# Patient Record
Sex: Female | Born: 1990 | Hispanic: No | Marital: Married | Smoking: Never smoker
Health system: Southern US, Community
[De-identification: ages and names within clinical notes are randomized; demographics above are authoritative.]

## PROBLEM LIST (undated history)

## (undated) DIAGNOSIS — G43909 Migraine, unspecified, not intractable, without status migrainosus: Secondary | ICD-10-CM

## (undated) HISTORY — DX: Migraine, unspecified, not intractable, without status migrainosus: G43.909

## (undated) HISTORY — PX: NO PAST SURGERIES: SHX2092

---
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
## 2010-01-05 ENCOUNTER — Emergency Department: Payer: Self-pay | Admitting: Emergency Medicine

## 2010-10-25 IMAGING — CT CT HEAD WITHOUT CONTRAST
2 series · 16 of 30 positions shown, 20 images · non-contrast
Comparison: none

REASON FOR EXAM: Headache New Onset Rt Sided
COMMENTS:

[Series 2: without · axial · non-contrast · 0.38mm/px · z∈[+690,+836]mm · 13 of 35 slices shown, 17 images]
[im 3/35  brain]
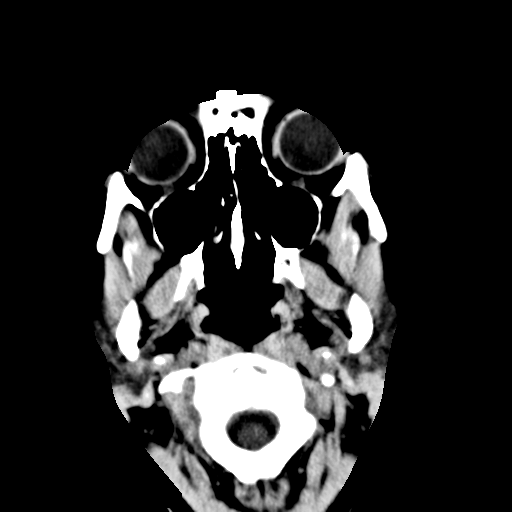
[im 3/35  bone]
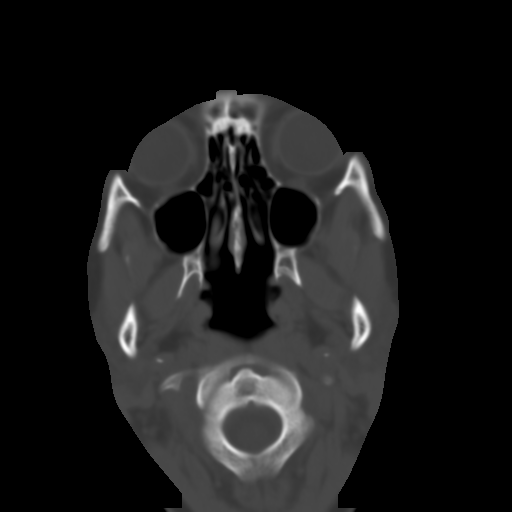
[im 5/35  brain]
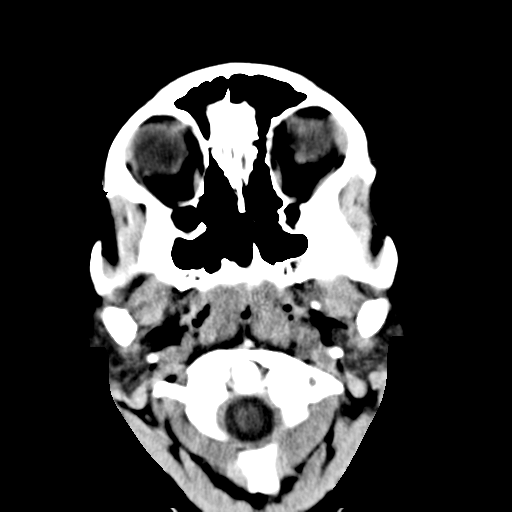
[im 8/35  brain]
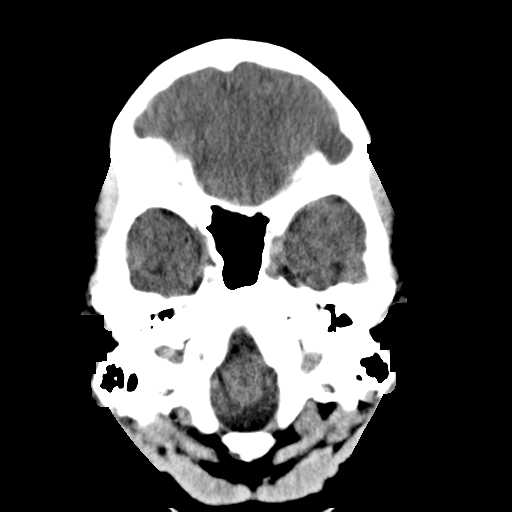
[im 10/35  brain]
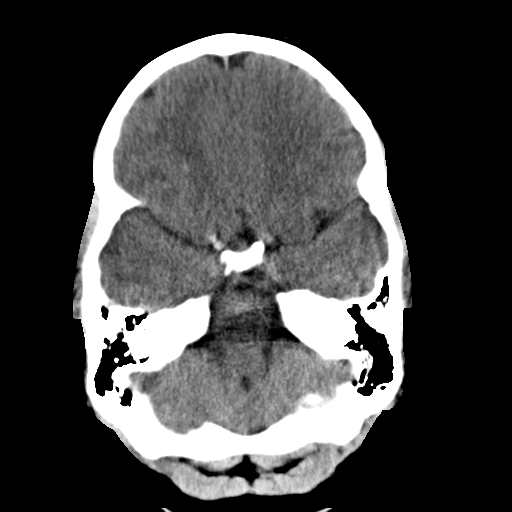
[im 13/35  brain]
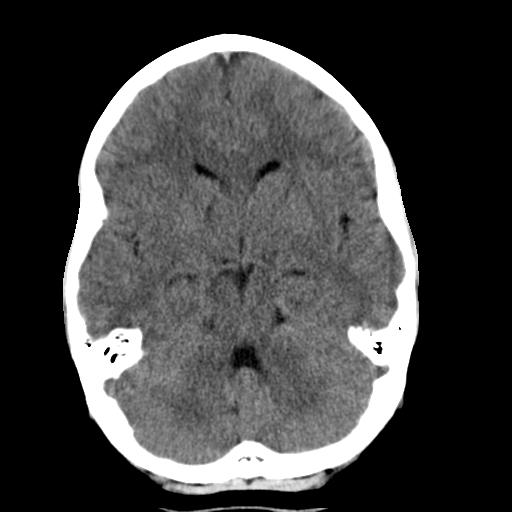
[im 13/35  bone]
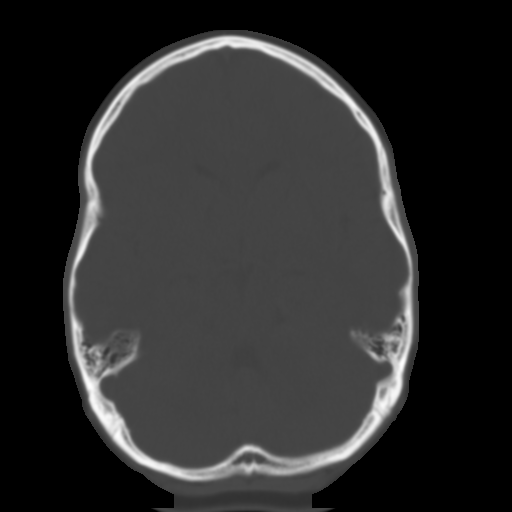
[im 15/35  brain]
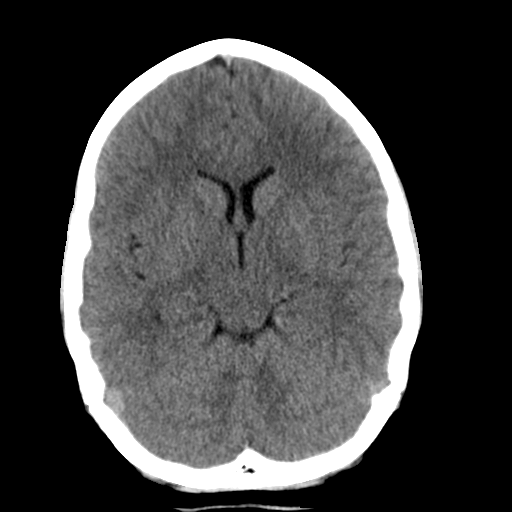
[im 18/35  brain]
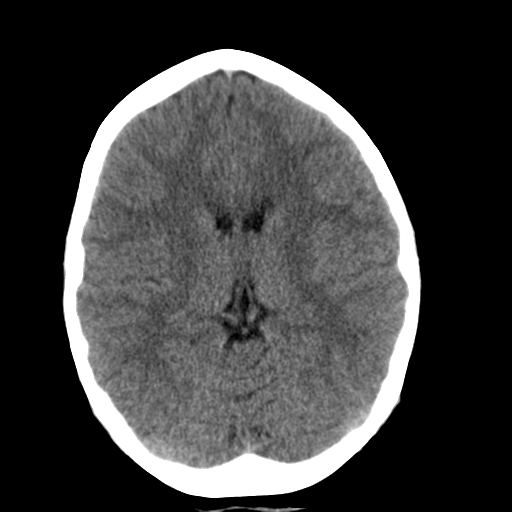
[im 20/35  brain]
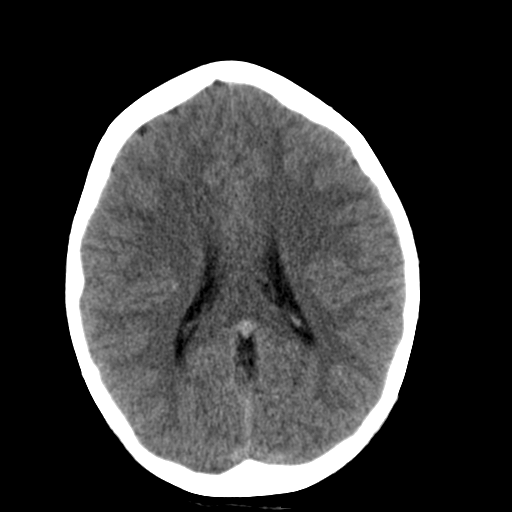
[im 22/35  brain]
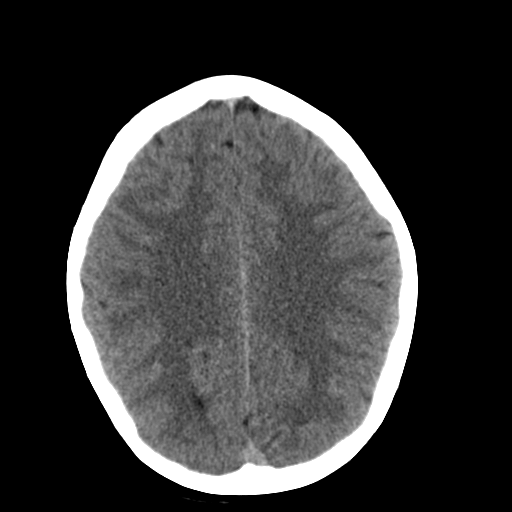
[im 22/35  bone]
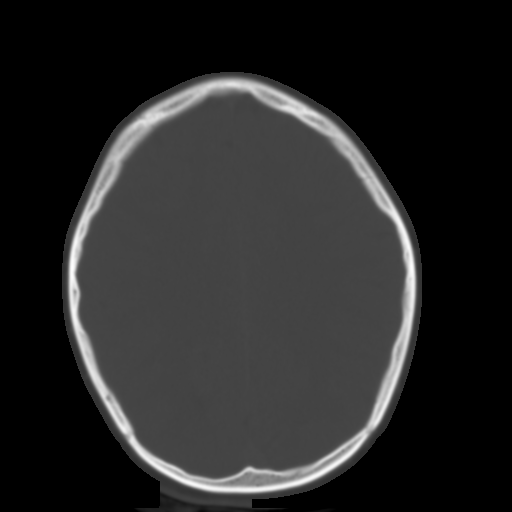
[im 25/35  brain]
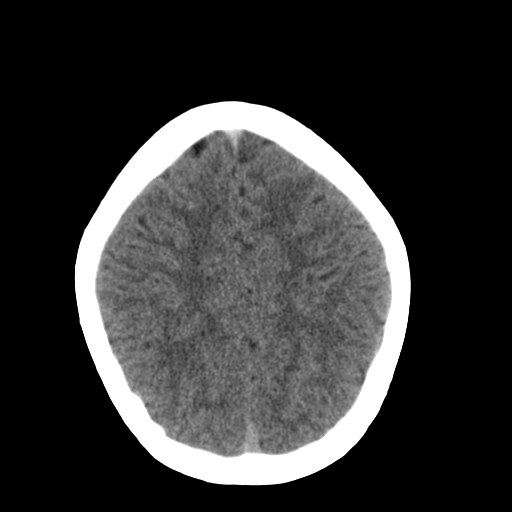
[im 27/35  brain]
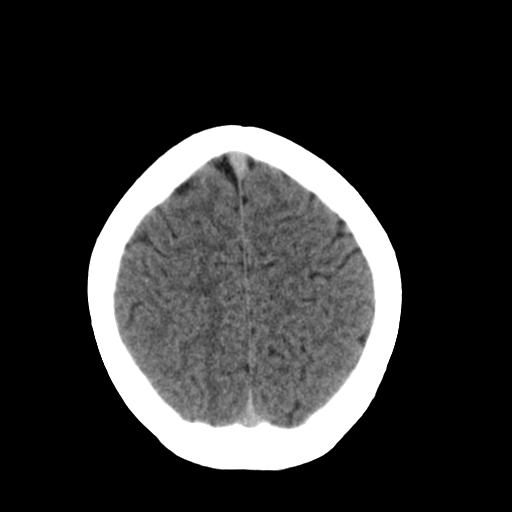
[im 30/35  brain]
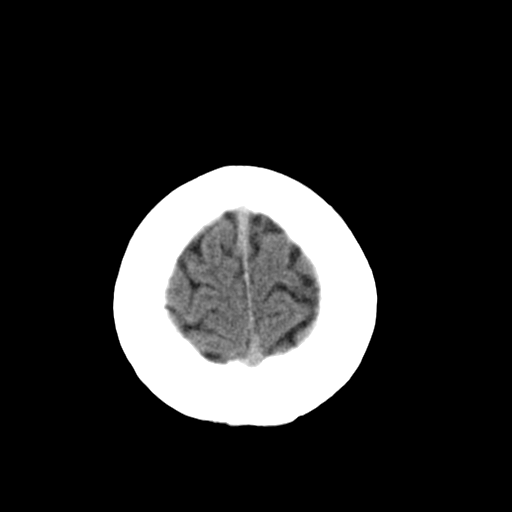
[im 32/35  brain]
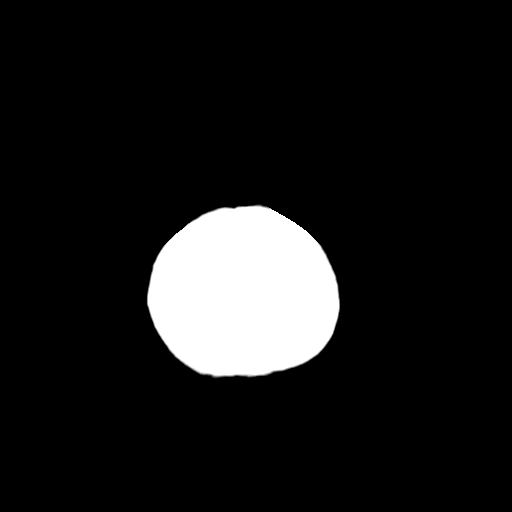
[im 32/35  bone]
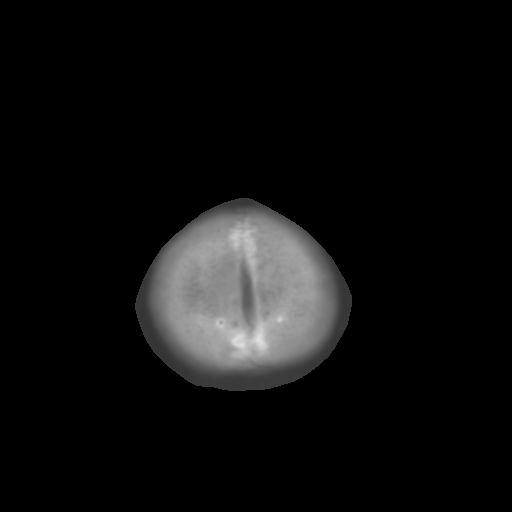

[Series 3: bone · axial · 0.38mm/px · z∈[+690,+740]mm · 3 of 35 slices shown]
[im 3/35  bone]
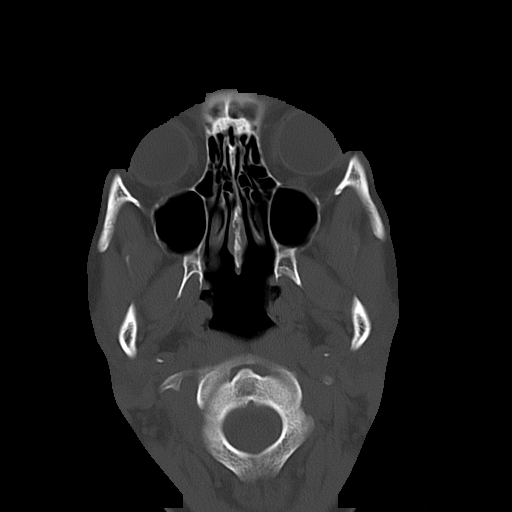
[im 8/35  bone]
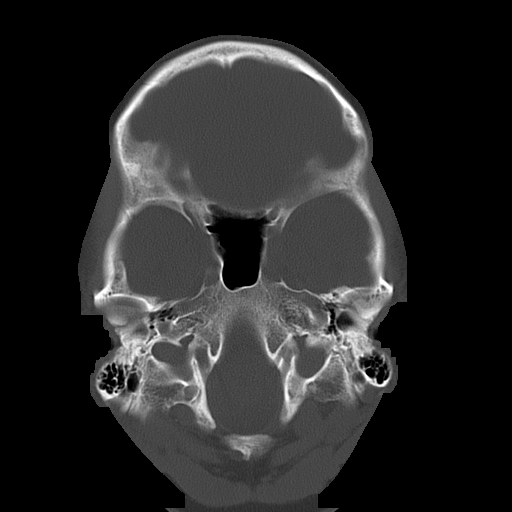
[im 13/35  bone]
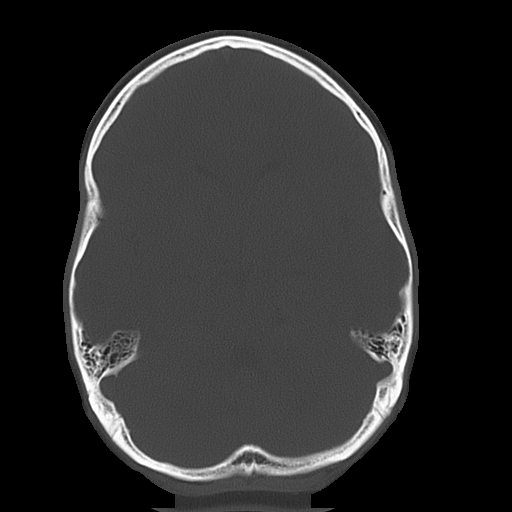

[16 of 30 positions shown; findings below may reference images not displayed]

PROCEDURE:     CT  - CT HEAD WITHOUT CONTRAST  - May 20, 2008  [DATE]

RESULT:     The patient is undergoing evaluation of new RIGHT-sided
headache.

The ventricles are normal in size and position. There is no intracranial
hemorrhage, mass, or mass effect. The cerebellum and brainstem are normal in
density. There are no findings to suggest an acute evolving ischemic
infarction. At bone window settings there is no evidence of a skull fracture
nor of a lytic nor blastic lesion. The mastoid air cells and observed
portions of the paranasal sinuses are clear.
IMPRESSION: I do not see findings on this study to explain the
patient's headache. Follow-up MRI may be useful if the patient's symptoms
persist and remain unexplained.

## 2011-07-01 IMAGING — CR DG KNEE COMPLETE 4+V*L*
1 series · 4 of 4 positions shown · non-contrast
Comparison: none

REASON FOR EXAM: injury pt in [place]
COMMENTS:

PROCEDURE:     DXR - DXR KNEE LT COMP WITH OBLIQUES  - January 25, 2009 [DATE]
RESULT:     Images of the left knee demonstrate no evidence of fracture,
dislocation or radiopaque foreign body.

[Series 1: view not recorded · 0.17mm/px · 4 of 4 slices shown]
[im 1/4]
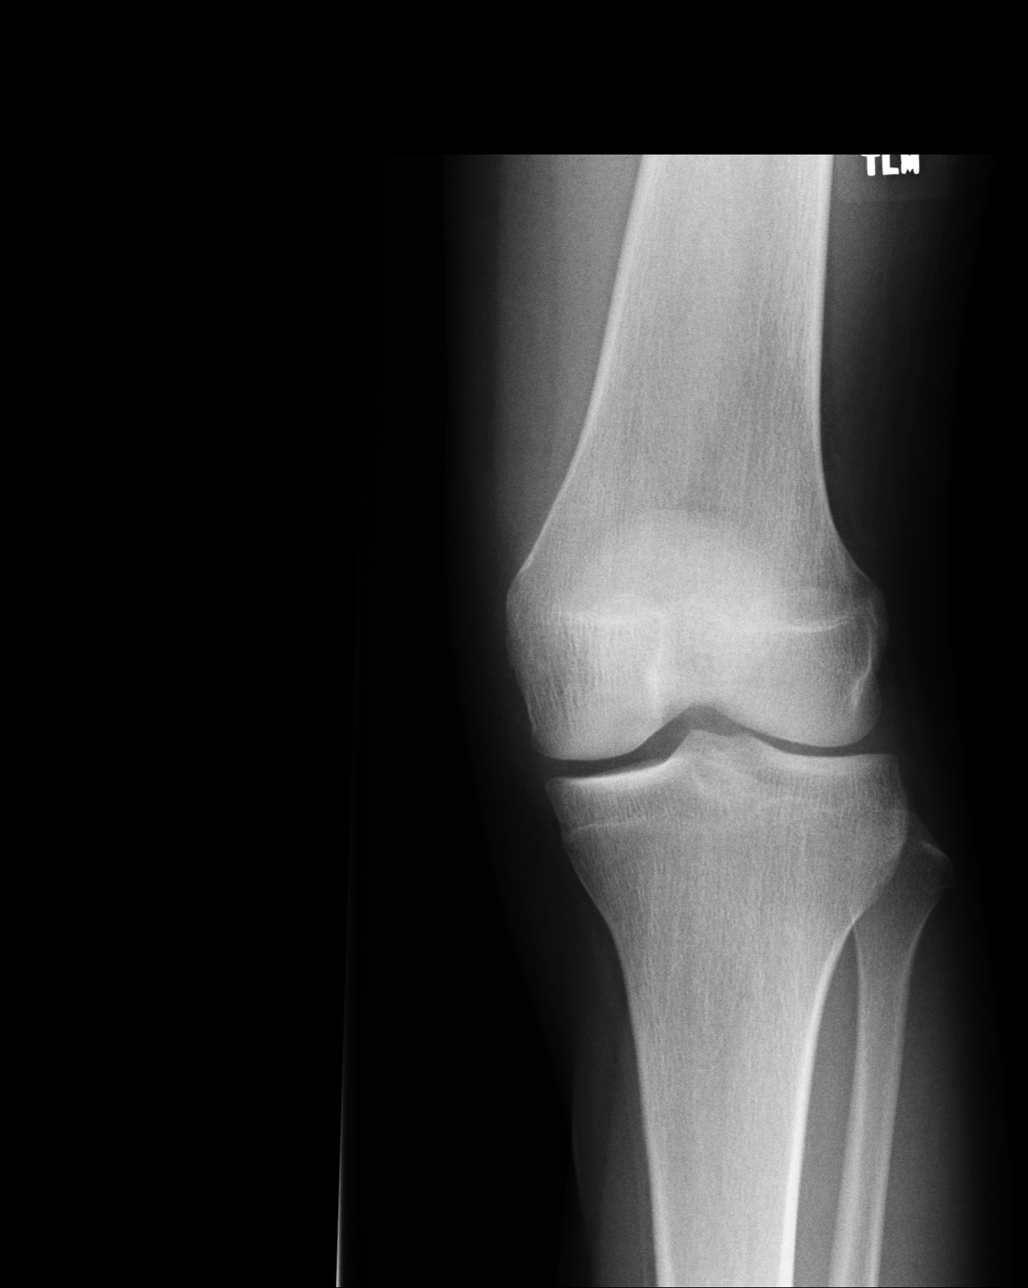
[im 2/4]
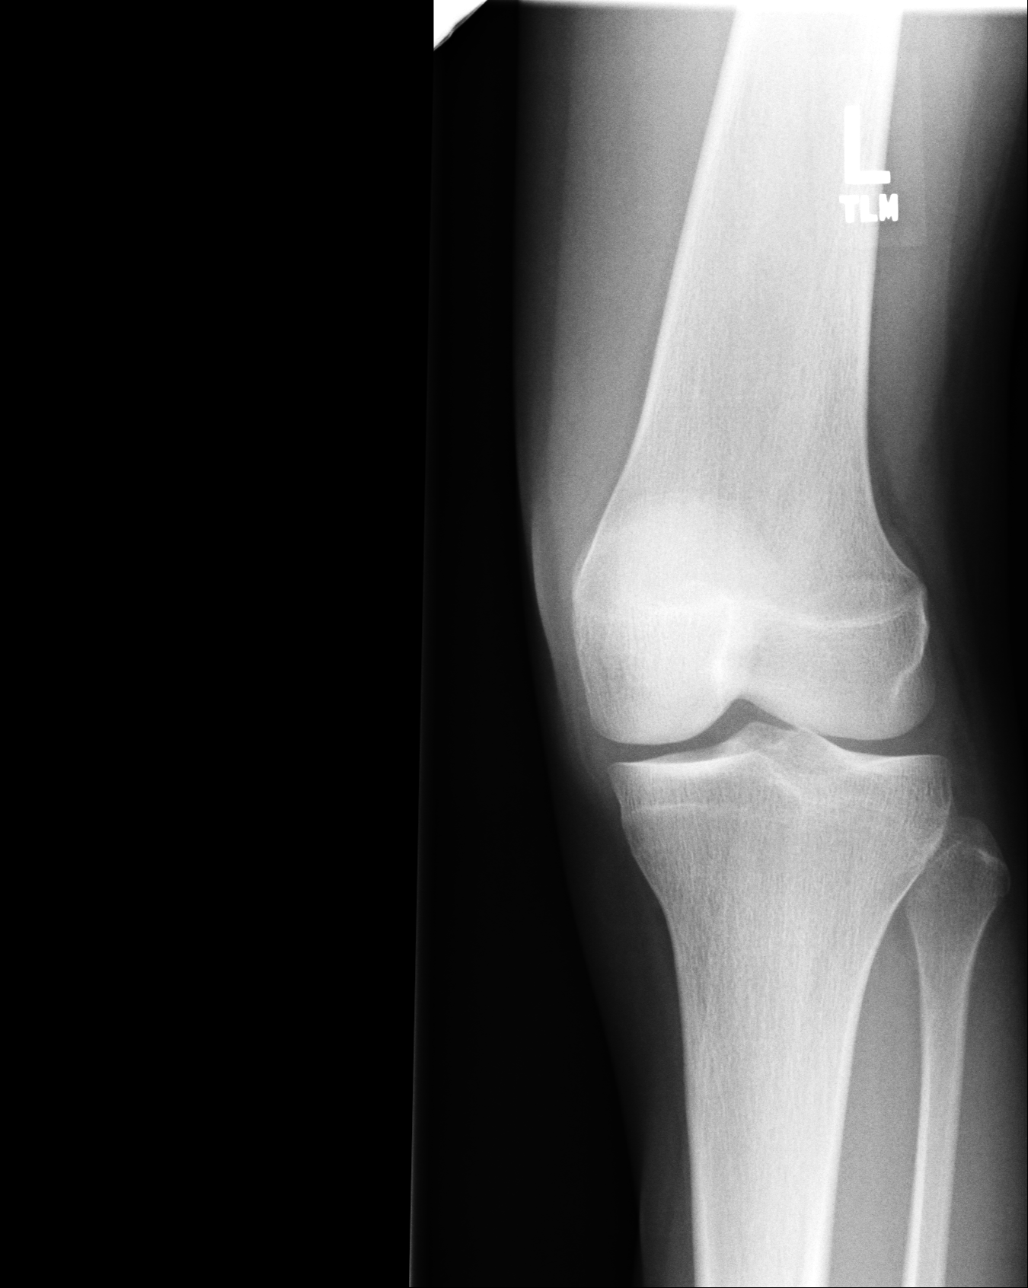
[im 3/4]
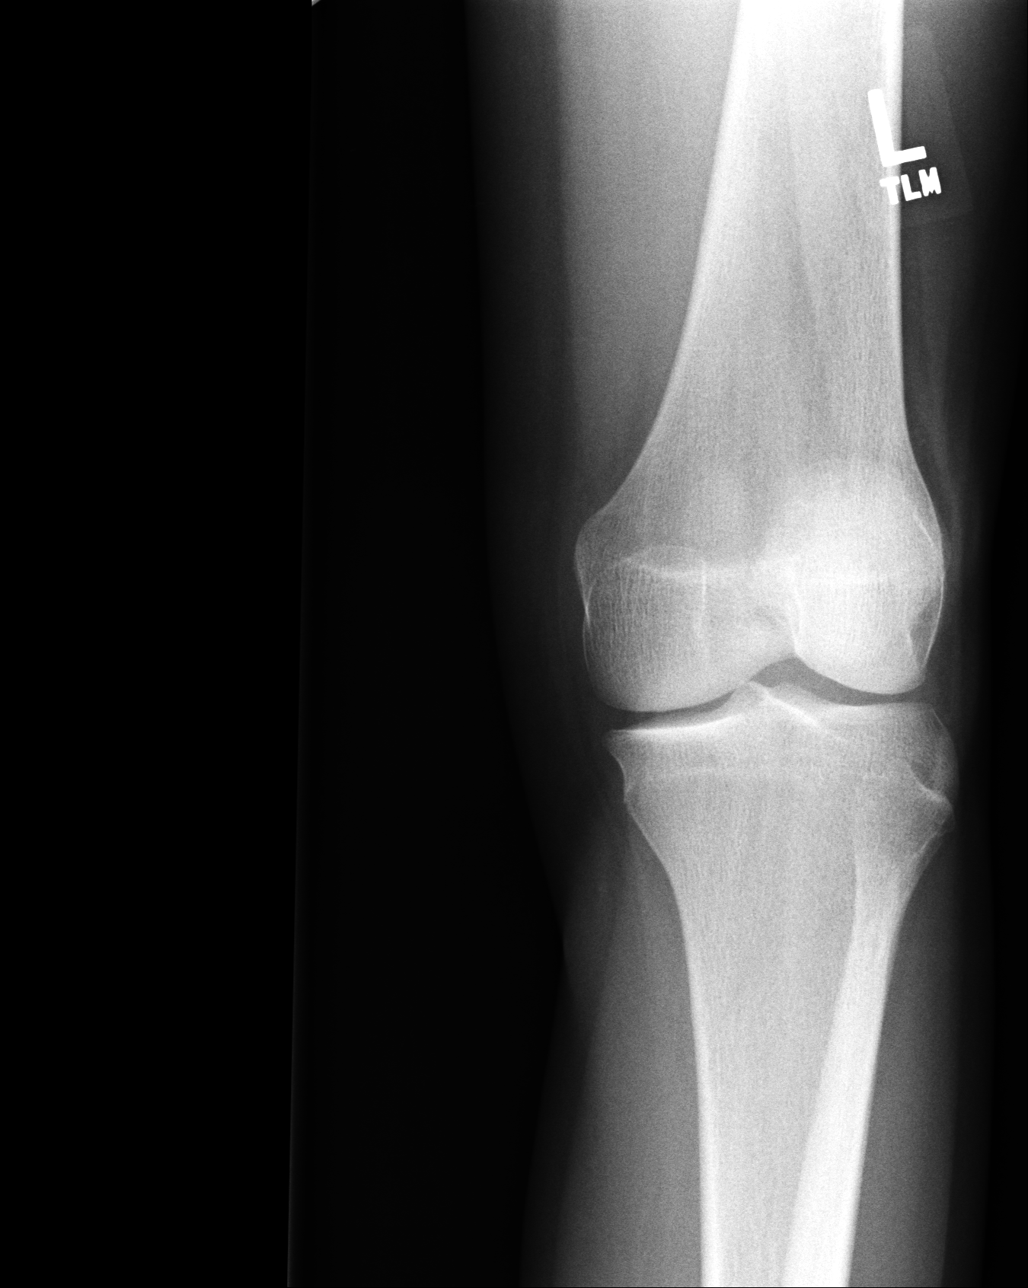
[im 4/4]
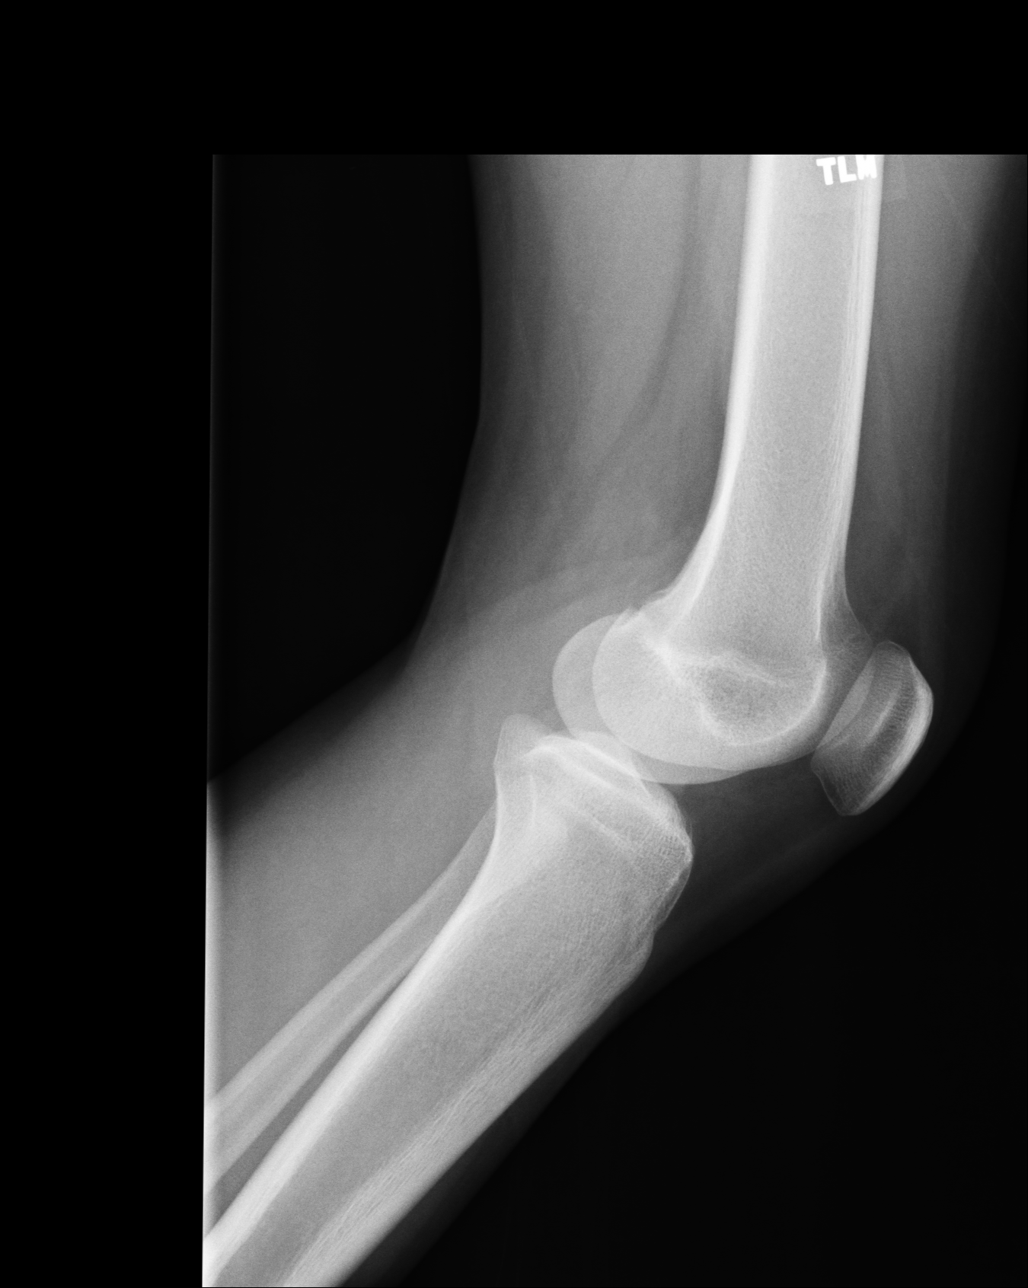

[4 of 4 positions shown; findings below may reference images not displayed]

IMPRESSION: No acute bony abnormality evident.

## 2016-04-15 ENCOUNTER — Ambulatory Visit (INDEPENDENT_AMBULATORY_CARE_PROVIDER_SITE_OTHER): Payer: BLUE CROSS/BLUE SHIELD | Admitting: Obstetrics and Gynecology

## 2016-04-15 ENCOUNTER — Encounter: Payer: Self-pay | Admitting: Obstetrics and Gynecology

## 2016-04-15 VITALS — BP 110/72 | HR 91 | Ht 64.0 in | Wt 143.8 lb

## 2016-04-15 DIAGNOSIS — Z3481 Encounter for supervision of other normal pregnancy, first trimester: Secondary | ICD-10-CM

## 2016-04-15 DIAGNOSIS — Z113 Encounter for screening for infections with a predominantly sexual mode of transmission: Secondary | ICD-10-CM

## 2016-04-15 DIAGNOSIS — O219 Vomiting of pregnancy, unspecified: Secondary | ICD-10-CM

## 2016-04-15 DIAGNOSIS — Z124 Encounter for screening for malignant neoplasm of cervix: Secondary | ICD-10-CM

## 2016-04-15 DIAGNOSIS — Z369 Encounter for antenatal screening, unspecified: Secondary | ICD-10-CM

## 2016-04-15 NOTE — Progress Notes (Signed)
OBSTETRIC INITIAL PRENATAL VISIT  Subjective:    Emily Dean is being seen today for her first obstetrical visit.  She is transferring care from [facility]. This is not a planned pregnancy. She is a 25 y.o. G2P0010 female at [redacted]w[redacted]d gestation, Estimated Date of Delivery: 11/02/16 with Patient's last menstrual period was 01/27/2016 (approximate), consistent with 10 week sono. Her obstetrical history is significant for none. Relationship with FOB: significant other, living together. Patient does intend to breast feed. Pregnancy history fully reviewed.    Obstetric History   G2   P0   T0   P0   A1   L0    SAB0   TAB0   Ectopic0   Multiple0   Live Births0     # Outcome Date GA Lbr Len/2nd Weight Sex Delivery Anes PTL Lv  2 Current           1 AB 2008              Gynecologic History:  Last pap smear was 05/2015.  Results were normal.  Denies h/o abnormal pap smears in the past.  Denies history of STIs.  Contraception: None   Past Medical History:  Diagnosis Date  . Migraine     Family History  Problem Relation Age of Onset  . Rheum arthritis Mother   . Diabetes Father   . Thyroid disease Father     Past Surgical History:  Procedure Laterality Date  . NO PAST SURGERIES      Social History   Social History  . Marital status: Single    Spouse name: N/A  . Number of children: N/A  . Years of education: N/A   Occupational History  . Not on file.   Social History Main Topics  . Smoking status: Never Smoker  . Smokeless tobacco: Never Used  . Alcohol use No  . Drug use: No  . Sexual activity: Yes     Comment: Pregnancy    Other Topics Concern  . Not on file   Social History Narrative  . No narrative on file    Outpatient Encounter Prescriptions as of 04/15/2016  Medication Sig Note  . ondansetron (ZOFRAN) 8 MG tablet TAKE ONE PILL BY MOUTH EVERY EIGH HOURS 04/15/2016: Received from: External Pharmacy  . Prenatal Vit-Fe Fumarate-FA (PNV  PRENATAL PLUS MULTIVITAMIN) 27-1 MG TABS Take 1 tablet by mouth daily. 04/15/2016: Received from: External Pharmacy Received Sig: TAKE ONE TABLET BY MOUTH DAILY   No facility-administered encounter medications on file as of 04/15/2016.     No Known Allergies   Review of Systems General:Not Present- Fever, Weight Loss and Weight Gain. Skin:Not Present- Rash. HEENT:Not Present- Blurred Vision, Headache and Bleeding Gums. Respiratory:Not Present- Difficulty Breathing. Breast:Present - Breast tenderness. Not Present- Breast Mass. Cardiovascular:Not Present- Chest Pain, Elevated Blood Pressure, Fainting / Blacking Out and Shortness of Breath. Gastrointestinal:Present - Nausea and Vomiting. Not Present- Abdominal Pain, Constipation. Female Genitourinary:Not Present- Frequency, Painful Urination, Pelvic Pain, Vaginal Bleeding, Vaginal Discharge, Contractions, regular, Fetal Movements Decreased, Urinary Complaints and Vaginal Fluid. Musculoskeletal:Not Present- Back Pain and Leg Cramps. Neurological:Not Present- Dizziness. Psychiatric:Not Present- Depression.     Objective:   Blood pressure 110/72, pulse 91, height 5\' 4"  (1.626 m), weight 143 lb 12.8 oz (65.2 kg), last menstrual period 01/27/2016.  Body mass index is 24.68 kg/m.  General Appearance:    Alert, cooperative, no distress, appears stated age.   Head:    Normocephalic, without obvious abnormality, atraumatic  Eyes:    PERRL, conjunctiva/corneas clear, EOM's intact, both eyes  Ears:    Normal external ear canals, both ears  Nose:   Nares normal, septum midline, mucosa normal, no drainage or sinus tenderness  Throat:   Lips, mucosa, and tongue normal; teeth and gums normal  Neck:   Supple, symmetrical, trachea midline, no adenopathy; thyroid: no enlargement/tenderness/nodules; no carotid bruit or JVD  Back:     Symmetric, no curvature, ROM normal, no CVA tenderness  Lungs:     Clear to auscultation bilaterally,  respirations unlabored  Chest Wall:    No tenderness or deformity   Heart:    Regular rate and rhythm, S1 and S2 normal, no murmur, rub or gallop  Breast Exam:    No tenderness, masses, or nipple abnormality  Abdomen:     Soft, non-tender, bowel sounds active all four quadrants, no masses, no organomegaly.  FH 11.  FHT 160 bpm.  Genitalia:    Pelvic:external genitalia normal, vagina without lesions, discharge, or tenderness, rectovaginal septum  normal. Cervix normal in appearance, no cervical motion tenderness, no adnexal masses or tenderness.  Pregnancy positive findings: uterine enlargement: 11 wk size, nontender.   Rectal:    Normal external sphincter.  No hemorrhoids appreciated. Internal exam not done.   Extremities:   Extremities normal, atraumatic, no cyanosis or edema  Pulses:   2+ and symmetric all extremities  Skin:   Skin color, texture, turgor normal, no rashes or lesions  Lymph nodes:   Cervical, supraclavicular, and axillary nodes normal  Neurologic:   CNII-XII intact, normal strength, sensation and reflexes throughout     Assessment:    Pregnancy at 11 and 4/7 weeks   Nausea and vomiting in pregnancy Cervical cancer screening  Plan:   Initial labs ordered. Pap smear up to date (performed last year) Prenatal vitamins encouraged. Problem list reviewed and updated. Nausea and vomiting of pregnancy, currently on Zofran 8 mg.  Advised on weaning from Zofran at this time if possible.  Can change to other nausea/vomiting meds if needed.  New OB counseling:  The patient has been given an overview regarding routine prenatal care.  Recommendations regarding diet, weight gain, and exercise in pregnancy were given. Prenatal testing, optional genetic testing, and ultrasound use in pregnancy were reviewed.  AFP3 discussed: patient desires to have Panorama performed.  Benefits of Breast Feeding were discussed. The patient is encouraged to consider nursing her baby post partum. Follow  up in 4 weeks.  50% of 30 min visit spent on counseling and coordination of care.    Hildred LaserAnika Maxamillian Tienda, MD [facility]

## 2016-04-16 ENCOUNTER — Other Ambulatory Visit: Payer: Self-pay | Admitting: Obstetrics and Gynecology

## 2016-04-16 DIAGNOSIS — O219 Vomiting of pregnancy, unspecified: Secondary | ICD-10-CM | POA: Insufficient documentation

## 2016-04-16 LAB — MONITOR DRUG PROFILE 14(MW)
Amphetamine Scrn, Ur: NEGATIVE ng/mL
BARBITURATE SCREEN URINE: NEGATIVE ng/mL
BENZODIAZEPINE SCREEN, URINE: NEGATIVE ng/mL
Buprenorphine, Urine: NEGATIVE ng/mL
CANNABINOIDS UR QL SCN: NEGATIVE ng/mL
Cocaine (Metab) Scrn, Ur: NEGATIVE ng/mL
Creatinine(Crt), U: 130.2 mg/dL (ref 20.0–300.0)
Fentanyl, Urine: NEGATIVE pg/mL
Meperidine Screen, Urine: NEGATIVE ng/mL
Methadone Screen, Urine: NEGATIVE ng/mL
OXYCODONE+OXYMORPHONE UR QL SCN: NEGATIVE ng/mL
Opiate Scrn, Ur: NEGATIVE ng/mL
Ph of Urine: 6.9 (ref 4.5–8.9)
Phencyclidine Qn, Ur: NEGATIVE ng/mL
Propoxyphene Scrn, Ur: NEGATIVE ng/mL
SPECIFIC GRAVITY: 1.027
Tramadol Screen, Urine: NEGATIVE ng/mL

## 2016-04-16 LAB — URINALYSIS, ROUTINE W REFLEX MICROSCOPIC
Bilirubin, UA: NEGATIVE
Glucose, UA: NEGATIVE
Ketones, UA: NEGATIVE
Leukocytes, UA: NEGATIVE
Nitrite, UA: NEGATIVE
Protein, UA: NEGATIVE
RBC, UA: NEGATIVE
Specific Gravity, UA: 1.028 (ref 1.005–1.030)
Urobilinogen, Ur: 0.2 mg/dL (ref 0.2–1.0)
pH, UA: 7 (ref 5.0–7.5)

## 2016-04-16 LAB — URINE CULTURE

## 2016-04-17 LAB — RUBELLA SCREEN: Rubella Antibodies, IGG: 3.83 index (ref >0.99)

## 2016-04-17 LAB — CBC WITH DIFFERENTIAL/PLATELET
Basophils Absolute: 0 10*3/uL (ref 0.0–0.2)
Basos: 0 %
EOS (ABSOLUTE): 0.1 10*3/uL (ref 0.0–0.4)
Eos: 1 %
Hematocrit: 39.8 % (ref 34.0–46.6)
Hemoglobin: 13.7 g/dL (ref 11.1–15.9)
Immature Grans (Abs): 0 10*3/uL (ref 0.0–0.1)
Immature Granulocytes: 0 %
Lymphocytes Absolute: 2.3 10*3/uL (ref 0.7–3.1)
Lymphs: 20 %
MCH: 30.9 pg (ref 26.6–33.0)
MCHC: 34.4 g/dL (ref 31.5–35.7)
MCV: 90 fL (ref 79–97)
Monocytes Absolute: 1 10*3/uL — ABNORMAL HIGH (ref 0.1–0.9)
Monocytes: 9 %
Neutrophils Absolute: 7.8 10*3/uL — ABNORMAL HIGH (ref 1.4–7.0)
Neutrophils: 70 %
Platelets: 244 10*3/uL (ref 150–379)
RBC: 4.44 x10E6/uL (ref 3.77–5.28)
RDW: 13.1 % (ref 12.3–15.4)
WBC: 11.3 10*3/uL — ABNORMAL HIGH (ref 3.4–10.8)

## 2016-04-17 LAB — HIV ANTIBODY (ROUTINE TESTING W REFLEX): HIV Screen 4th Generation wRfx: NONREACTIVE

## 2016-04-17 LAB — ANTIBODY SCREEN: Antibody Screen: NEGATIVE

## 2016-04-17 LAB — VARICELLA ZOSTER ANTIBODY, IGG: Varicella zoster IgG: 949 index (ref >165)

## 2016-04-17 LAB — ABO AND RH: Rh Factor: POSITIVE

## 2016-04-17 LAB — RPR: RPR Ser Ql: NONREACTIVE

## 2016-04-17 LAB — HEPATITIS B SURFACE ANTIGEN: Hepatitis B Surface Ag: NEGATIVE

## 2016-04-17 LAB — SICKLE CELL SCREEN: Sickle Cell Screen: NEGATIVE

## 2016-04-20 LAB — GC/CHLAMYDIA PROBE AMP
Chlamydia trachomatis, NAA: NEGATIVE
Neisseria gonorrhoeae by PCR: NEGATIVE

## 2016-04-26 ENCOUNTER — Encounter: Payer: Self-pay | Admitting: Obstetrics and Gynecology

## 2016-04-26 ENCOUNTER — Telehealth: Payer: Self-pay

## 2016-04-26 NOTE — Telephone Encounter (Signed)
Called pt informed of neg results. Will have sex of baby picked up today by mother.

## 2016-04-26 NOTE — Telephone Encounter (Signed)
-----   Message from Hildred LaserAnika Cherry, MD sent at 04/26/2016  9:31 AM EDT ----- Please inform of normal panorama.  Is female fetus if patient desires to know sex.

## 2016-05-03 ENCOUNTER — Encounter: Payer: Self-pay | Admitting: Obstetrics and Gynecology

## 2016-05-05 ENCOUNTER — Telehealth: Payer: Self-pay | Admitting: Obstetrics and Gynecology

## 2016-05-05 NOTE — Telephone Encounter (Signed)
PT CALLED AND SHE WOULD LIKE A CALL BACK, SHE IS CURIOUS TO KNOW, SHE HAD SEX YESTERDAY AND IT WAS THE FIRST TIME SINCE SHE FOUND OUT SHE WAS PREGNANT, AND SHE STARTED CRAMPING THIS MORNING WHEN SHE WOKE UP AND THEN IT WENT AWAY, AND HAS FELT IT THRU OUT THE DAY, AND SHE DIDN'T KNOW IF THE CRAMPING COULD BE RELATED TO HER HAVING INTERCOURSE, NO BLEEDING.

## 2016-05-06 NOTE — Telephone Encounter (Signed)
Called pt informed her that it is normal to have some cramping after intercourse advised on tylenol and hydration. Pt gave verbal understanding, states she feels fine today.

## 2016-05-13 ENCOUNTER — Ambulatory Visit (INDEPENDENT_AMBULATORY_CARE_PROVIDER_SITE_OTHER): Payer: BLUE CROSS/BLUE SHIELD | Admitting: Obstetrics and Gynecology

## 2016-05-13 VITALS — BP 135/77 | HR 96 | Wt 148.0 lb

## 2016-05-13 DIAGNOSIS — Z3482 Encounter for supervision of other normal pregnancy, second trimester: Secondary | ICD-10-CM

## 2016-05-13 LAB — POCT URINALYSIS DIPSTICK
Bilirubin, UA: NEGATIVE
Blood, UA: NEGATIVE
Glucose, UA: NEGATIVE
Ketones, UA: NEGATIVE
Leukocytes, UA: NEGATIVE
Nitrite, UA: NEGATIVE
Protein, UA: NEGATIVE
Spec Grav, UA: 1.01
Urobilinogen, UA: 0.2
pH, UA: 6.5

## 2016-05-13 NOTE — Progress Notes (Addendum)
ROB: Doing well, denies complaints.  For flu vaccine next visit and anatomy scan.

## 2016-06-10 ENCOUNTER — Ambulatory Visit (INDEPENDENT_AMBULATORY_CARE_PROVIDER_SITE_OTHER): Payer: BLUE CROSS/BLUE SHIELD | Admitting: Obstetrics and Gynecology

## 2016-06-10 ENCOUNTER — Ambulatory Visit (INDEPENDENT_AMBULATORY_CARE_PROVIDER_SITE_OTHER): Payer: BLUE CROSS/BLUE SHIELD

## 2016-06-10 VITALS — BP 116/74 | HR 106 | Wt 147.4 lb

## 2016-06-10 DIAGNOSIS — Z3482 Encounter for supervision of other normal pregnancy, second trimester: Secondary | ICD-10-CM | POA: Diagnosis not present

## 2016-06-10 DIAGNOSIS — Z23 Encounter for immunization: Secondary | ICD-10-CM

## 2016-06-10 DIAGNOSIS — O26812 Pregnancy related exhaustion and fatigue, second trimester: Secondary | ICD-10-CM

## 2016-06-10 LAB — POCT URINALYSIS DIPSTICK
Bilirubin, UA: NEGATIVE
Glucose, UA: NEGATIVE
Ketones, UA: NEGATIVE
Leukocytes, UA: NEGATIVE
Nitrite, UA: NEGATIVE
Protein, UA: NEGATIVE
Spec Grav, UA: 1.02
Urobilinogen, UA: NEGATIVE
pH, UA: 6

## 2016-06-10 NOTE — Progress Notes (Signed)
ROB: Patient c/o fatigue/weakness.  Discussed that this was normal in pregnancy. Encouraged adequate diet with increased protein intake (patient notes that she doesn't really eat, mostly snacks), and adequate hydration and rest.  S/p normal anatomy scan today.  For flu vaccine today.  RTC in 4 weeks.

## 2016-07-05 NOTE — L&D Delivery Note (Signed)
Delivery Summary for Emily Dean  Labor Events:   Preterm labor:   Rupture date:   Rupture time:   Rupture type: Possible ROM - for evaluation  Fluid Color:   Induction:   Augmentation:   Complications:   Cervical ripening:          Delivery:   Episiotomy:   Lacerations:   Repair suture:   Repair # of packets:   Blood loss (ml): 300   Information for the patient's newborn:  Tahesha, Skeet [[id number]]    Delivery 10/28/2016 12:03 AM by  Vaginal, Spontaneous Delivery Sex:  female Gestational Age: [redacted]w[redacted]d Delivery Clinician:   Living?:         APGARS  One minute Five minutes Ten minutes  Skin color:        Heart rate:        Grimace:        Muscle tone:        Breathing:        Totals: 8  9      Presentation/position:      Resuscitation:   Cord information:    Disposition of cord blood:     Blood gases sent?  Complications:   Placenta: Delivered:       appearance Newborn Measurements: Weight: 5 lb 12.1 oz (2610 g)  Height: 19.29"  Head circumference:    Chest circumference:    Other providers:    Additional  information: Forceps:   Vacuum:   Breech:   Observed anomalies       Delivery Note At 12:03 AM a viable and healthy female was delivered via precipitous Vaginal, Spontaneous Delivery (Presentation: Vertex; LOA position).  APGAR: 8, 9; weight 2610 grams.   Placenta status: spontaneously removed, intact.  Cord: 3-vessel with the following complications: none.  Cord pH: not obtained.  Anesthesia: Nitrous Oxide Episiotomy: None Lacerations: None Suture Repair: none Est. Blood Loss (mL): 300  Mom to postpartum.  Baby to Couplet care / Skin to Skin.  Hildred Laser 10/28/2016, 12:38 AM

## 2016-07-08 ENCOUNTER — Ambulatory Visit (INDEPENDENT_AMBULATORY_CARE_PROVIDER_SITE_OTHER): Payer: BLUE CROSS/BLUE SHIELD | Admitting: Obstetrics and Gynecology

## 2016-07-08 ENCOUNTER — Encounter: Payer: Self-pay | Admitting: Obstetrics and Gynecology

## 2016-07-08 VITALS — BP 122/77 | HR 100 | Wt 153.4 lb

## 2016-07-08 DIAGNOSIS — Z130 Encounter for screening for diseases of the blood and blood-forming organs and certain disorders involving the immune mechanism: Secondary | ICD-10-CM

## 2016-07-08 DIAGNOSIS — Z3482 Encounter for supervision of other normal pregnancy, second trimester: Secondary | ICD-10-CM

## 2016-07-08 DIAGNOSIS — Z131 Encounter for screening for diabetes mellitus: Secondary | ICD-10-CM

## 2016-07-08 LAB — POCT URINALYSIS DIPSTICK
Bilirubin, UA: NEGATIVE
Blood, UA: NEGATIVE
Glucose, UA: NEGATIVE
Ketones, UA: NEGATIVE
Leukocytes, UA: NEGATIVE
Nitrite, UA: NEGATIVE
Protein, UA: NEGATIVE
Spec Grav, UA: 1.01
Urobilinogen, UA: NEGATIVE
pH, UA: 7

## 2016-07-08 NOTE — Progress Notes (Signed)
ROB: Doing well. Desires notice for work for lifting restrictions.  Letter provided. RTC in 4 weeks, for 28 week labs at that time.

## 2016-07-08 NOTE — Patient Instructions (Signed)
Minor Illnesses and Medications in Pregnancy  Cold/Flu:  Sudafed for congestion- Robitussin (plain) for cough- Tylenol for discomfort.  Please follow the directions on the label.  Try not to take any more than needed.  OTC Saline nasal spray and air humidifier or cool-mist  Vaporizer to sooth nasal irritation and to loosen congestion.  It is also important to increase intake of non carbonated fluids, especially if you have a fever.  Constipation:  Colace-2 capsules at bedtime; Metamucil- follow directions on label; Senokot- 1 tablet at bedtime.  Any one of these medications can be used.  It is also very important to increase fluids and fruits along with regular exercise.  If problem persists please call the office.  Diarrhea:  Kaopectate as directed on the label.  Eat a bland diet and increase fluids.  Avoid highly seasoned foods.  Headache:  Tylenol 1 or 2 tablets every 3-4 hours as needed  Indigestion:  Maalox, Mylanta, Tums or Rolaids- as directed on label.  Also try to eat small meals and avoid fatty, greasy or spicy foods.  Nausea with or without Vomiting:  Nausea in pregnancy is caused by increased levels of hormones in the body which influence the digestive system and cause irritation when stomach acids accumulate.  Symptoms usually subside after 1st trimester of pregnancy.  Try the following: 1. Keep saltines, graham crackers or dry toast by your bed to eat upon awakening. 2. Don't let your stomach get empty.  Try to eat 5-6 small meals per day instead of 3 large ones. 3. Avoid greasy fatty or highly seasoned foods.  4. Take OTC Unisom 1 tablet at bed time along with OTC Vitamin B6 25-50 mg 3 times per day.    If nausea continues with vomiting and you are unable to keep down food and fluids you may need a prescription medication.  Please notify your provider.   Sore throat:  Chloraseptic spray, throat lozenges and or plain Tylenol.  Vaginal Yeast Infection:  OTC Monistat for 7 days as  directed on label.  If symptoms do not resolve within a week notify provider.  If any of the above problems do not subside with recommended treatment please call the office for further assistance.   Do not take Aspirin, Advil, Motrin or Ibuprofen.  * * OTC= Over the counter  

## 2016-07-08 NOTE — Progress Notes (Deleted)
ROB: Needs letter for work to decrease heavy lifting.

## 2016-07-19 ENCOUNTER — Telehealth: Payer: Self-pay | Admitting: Obstetrics and Gynecology

## 2016-07-19 NOTE — Telephone Encounter (Signed)
Called pt she states that she is having a sharp continuous pain under her right arm, pt notes that she has been lifting at work as she works [employer] (was recently given a note for restrictions) pt notes that she has been trying to take tylenol but it is not helping, pt notes that she is unable to sleep due to this pain. Advised pt on the need to be seen, states she cannot be seen today due to work, will add to Michelle's schedule for tomorrow AM 8:15. Pt gave verbal understanding.

## 2016-07-19 NOTE — Telephone Encounter (Signed)
PT CALLED AND STATED SHE WOULD LIKE A CALL BACK FROM YOU.

## 2016-07-20 ENCOUNTER — Ambulatory Visit (INDEPENDENT_AMBULATORY_CARE_PROVIDER_SITE_OTHER): Payer: BLUE CROSS/BLUE SHIELD | Admitting: Certified Nurse Midwife

## 2016-07-20 VITALS — BP 114/77 | HR 90 | Wt 155.3 lb

## 2016-07-20 DIAGNOSIS — Z3492 Encounter for supervision of normal pregnancy, unspecified, second trimester: Secondary | ICD-10-CM

## 2016-07-20 LAB — POCT URINALYSIS DIPSTICK
Bilirubin, UA: NEGATIVE
Blood, UA: NEGATIVE
Glucose, UA: NEGATIVE
Ketones, UA: NEGATIVE
Leukocytes, UA: NEGATIVE
Nitrite, UA: NEGATIVE
Protein, UA: NEGATIVE
Spec Grav, UA: 1.01
Urobilinogen, UA: NEGATIVE
pH, UA: 5

## 2016-07-20 NOTE — Progress Notes (Signed)
Pt is here with c/o back pain mid back. Feels like a pulled muscle. Denies coughing. C/o SOB while lying down.

## 2016-07-20 NOTE — Patient Instructions (Signed)
Back Pain in Pregnancy Introduction Back pain during pregnancy is common. Back pain may be caused by several factors that are related to changes during your pregnancy. Follow these instructions at home: Managing pain, stiffness, and swelling  If directed, apply ice for sudden (acute) back pain.  Put ice in a plastic bag.  Place a towel between your skin and the bag.  Leave the ice on for 20 minutes, 2-3 times per day.  If directed, apply heat to the affected area before you exercise:  Place a towel between your skin and the heat pack or heating pad.  Leave the heat on for 20-30 minutes.  Remove the heat if your skin turns bright red. This is especially important if you are unable to feel pain, heat, or cold. You may have a greater risk of getting burned. Activity  Exercise as told by your health care provider. Exercising is the best way to prevent or manage back pain.  Listen to your body when lifting. If lifting hurts, ask for help or bend your knees. This uses your leg muscles instead of your back muscles.  Squat down when picking up something from the floor. Do not bend over.  Only use bed rest as told by your health care provider. Bed rest should only be used for the most severe episodes of back pain. Standing, Sitting, and Lying Down  Do not stand in one place for long periods of time.  Use good posture when sitting. Make sure your head rests over your shoulders and is not hanging forward. Use a pillow on your lower back if necessary.  Try sleeping on your side, preferably the left side, with a pillow or two between your legs. If you are sore after a night's rest, your bed may be too soft. A firm mattress may provide more support for your back during pregnancy. General instructions  Do not wear high heels.  Eat a healthy diet. Try to gain weight within your health care provider's recommendations.  Use a maternity girdle, elastic sling, or back brace as told by your  health care provider.  Take over-the-counter and prescription medicines only as told by your health care provider.  Keep all follow-up visits as told by your health care provider. This is important. This includes any visits with any specialists, such as a physical therapist. Contact a health care provider if:  Your back pain interferes with your daily activities.  You have increasing pain in other parts of your body. Get help right away if:  You develop numbness, tingling, weakness, or problems with the use of your arms or legs.  You develop severe back pain that is not controlled with medicine.  You have a sudden change in bowel or bladder control.  You develop shortness of breath, dizziness, or you faint.  You develop nausea, vomiting, or sweating.  You have back pain that is a rhythmic, cramping pain similar to labor pains. Labor pain is usually 1-2 minutes apart, lasts for about 1 minute, and involves a bearing down feeling or pressure in your pelvis.  You have back pain and your water breaks or you have vaginal bleeding.  You have back pain or numbness that travels down your leg.  Your back pain developed after you fell.  You develop pain on one side of your back.  You see blood in your urine.  You develop skin blisters in the area of your back pain. This information is not intended to replace advice given to   you by your health care provider. Make sure you discuss any questions you have with your health care provider. Document Released: 09/29/2005 Document Revised: 11/27/2015 Document Reviewed: 03/05/2015  2017 Elsevier  

## 2016-07-20 NOTE — Progress Notes (Signed)
Problem visit-Pt here for evaluation of right sided upper back pain x 6 days. She reports trouble sleeping and increasing pain with movement and position changed. Pain decreases with use of icy hot and Tylenol. Pt has been taking 1 Tylenol capsule q 4 hrs. Lungs CTAB, Heart RRR, Full ROM upper extremities. Discussed use of stretching and OTC measures including correct Tylenol dosage. Cards given for local chiropractor and massage therapist. Reviewed red flag symptoms and when to call. RTC if symptoms worsen or fail to improve. RTC for ROB as previously scheduled.

## 2016-07-23 ENCOUNTER — Emergency Department (HOSPITAL_COMMUNITY)
Admission: EM | Admit: 2016-07-23 | Discharge: 2016-07-23 | Disposition: A | Discharge status: Home / Self Care | Payer: BLUE CROSS/BLUE SHIELD | Attending: Emergency Medicine | Admitting: Emergency Medicine

## 2016-07-23 ENCOUNTER — Encounter (HOSPITAL_COMMUNITY): Payer: Self-pay

## 2016-07-23 DIAGNOSIS — Z3A25 25 weeks gestation of pregnancy: Secondary | ICD-10-CM | POA: Insufficient documentation

## 2016-07-23 DIAGNOSIS — G43809 Other migraine, not intractable, without status migrainosus: Secondary | ICD-10-CM | POA: Insufficient documentation

## 2016-07-23 DIAGNOSIS — O99352 Diseases of the nervous system complicating pregnancy, second trimester: Secondary | ICD-10-CM | POA: Diagnosis not present

## 2016-07-23 LAB — COMPREHENSIVE METABOLIC PANEL
ALT: 24 U/L (ref 14–54)
AST: 20 U/L (ref 15–41)
Albumin: 3 g/dL — ABNORMAL LOW (ref 3.5–5.0)
Alkaline Phosphatase: 67 U/L (ref 38–126)
Anion gap: 9 (ref 5–15)
BUN: <5 mg/dL — ABNORMAL LOW (ref 6–20)
CO2: 19 mmol/L — ABNORMAL LOW (ref 22–32)
Calcium: 8.3 mg/dL — ABNORMAL LOW (ref 8.9–10.3)
Chloride: 105 mmol/L (ref 101–111)
Creatinine, Ser: 0.54 mg/dL (ref 0.44–1.00)
GFR calc Af Amer: >60 mL/min (ref >60)
GFR calc non Af Amer: >60 mL/min (ref >60)
Glucose, Bld: 72 mg/dL (ref 65–99)
Potassium: 3.7 mmol/L (ref 3.5–5.1)
Sodium: 133 mmol/L — ABNORMAL LOW (ref 135–145)
Total Bilirubin: 0.5 mg/dL (ref 0.3–1.2)
Total Protein: 5.8 g/dL — ABNORMAL LOW (ref 6.5–8.1)

## 2016-07-23 LAB — URINALYSIS, ROUTINE W REFLEX MICROSCOPIC
Bilirubin Urine: NEGATIVE
Glucose, UA: NEGATIVE mg/dL
Hgb urine dipstick: NEGATIVE
Ketones, ur: NEGATIVE mg/dL
Leukocytes, UA: NEGATIVE
Nitrite: NEGATIVE
Protein, ur: NEGATIVE mg/dL
Specific Gravity, Urine: 1.015 (ref 1.005–1.030)
pH: 5 (ref 5.0–8.0)

## 2016-07-23 MED ORDER — DIPHENHYDRAMINE HCL 50 MG/ML IJ SOLN
25.0000 mg | Freq: Once | INTRAMUSCULAR | Status: AC
  Administered 2016-07-23: 25 mg via INTRAVENOUS
  Filled 2016-07-23: qty 1

## 2016-07-23 MED ORDER — SODIUM CHLORIDE 0.9 % IV BOLUS (SEPSIS)
1000.0000 mL | Freq: Once | INTRAVENOUS | Status: AC
  Administered 2016-07-23: 1000 mL via INTRAVENOUS

## 2016-07-23 MED ORDER — METOCLOPRAMIDE HCL 5 MG/ML IJ SOLN
10.0000 mg | Freq: Once | INTRAMUSCULAR | Status: AC
  Administered 2016-07-23: 10 mg via INTRAVENOUS
  Filled 2016-07-23: qty 2

## 2016-07-23 MED ORDER — BUTALBITAL-APAP-CAFFEINE 50-300-40 MG PO CAPS: 1.0000 | ORAL_CAPSULE | Freq: Three times a day (TID) | ORAL | 0 refills | Status: DC | PRN

## 2016-07-23 NOTE — ED Notes (Signed)
[facility] contacted about coming to see the pt.

## 2016-07-23 NOTE — Progress Notes (Signed)
RROB called to patient's bedside who [facility] with complaints of a headache that has gotten progressively worse since yesterday afternoon; patient is a G2P0 who is 25 and 4/[redacted] weeks along in her pregnancy at this time; she denies problems with the pregnancy; she states she has a history of migraines prior to pregnancy; denies pregnancy complaints at this time; EFM applied and assessing; patient recieves regular prenatal care with Dr Valentino Saxonherry in [place]; Dr Vergie LivingPickens called and made aware of patient's arrival and complaint; orders given for NST, CMP, 10mg  oxycodone and fioricet 1 tab if needed; orders passed on to ED staff at this time; will continue to monitor

## 2016-07-23 NOTE — ED Notes (Signed)
[facility] line busy.

## 2016-07-23 NOTE — ED Notes (Signed)
Pt and family understood dc material. NAD noted. Scripts given at dc 

## 2016-07-23 NOTE — ED Triage Notes (Signed)
Pt. Having sharp head pain, lt. temperal area.  She denies any n/v/d    Movement increases the pain.  [redacted] weeks pregnant , baby is moving .  BP is elevated 130/103,    Pt. Denies any blurred vision. No vaginal bleeding or pain

## 2016-07-23 NOTE — ED Notes (Signed)
[facility] lab contacted about collecting cmp. Unable to add on downstairs

## 2016-07-23 NOTE — ED Notes (Signed)
Attempted to call rapid response OB RN. No answer. Will continue to try to contact

## 2016-07-23 NOTE — ED Notes (Signed)
Recollect CMP per RN

## 2016-07-23 NOTE — Progress Notes (Signed)
Patient states pain has improved at this time; Dr Vergie LivingPickens called to review strip; orders given for OB clearance at this time

## 2016-07-23 NOTE — ED Notes (Signed)
Gave pt ice pack for headache 

## 2016-07-23 NOTE — ED Provider Notes (Signed)
[facility]-EMERGENCY DEPT Provider Note   CSN: [id number] Arrival date & time: 07/23/16  1657     History   Chief Complaint Chief Complaint  Patient presents with  . Headache    HPI Emily Dean is a 26 y.o. female.  HPI   Left temple, if move it goes to back of head. Ice helps but when remove ice it comes back.  HA started yesterday afternoon and by last night it was more severe.  Tylenol no relief of headache.  Ice helps. Bright lights make it worse. No fevers. No neck stiffness, no numbness/weakness.  Hx of migraines, this headache is worse, lasting longer.  Nausea but no vomiting.  No vaginal bleeding, no leakage of fluid, no abd pain.  Is feeling normal fetal movement.  Dr. Lowella Curbherry [place], [facility] [redacted]wk pregnant G1 per pt, however on review with physician she had one other pregnancy, abortion not documented spontaneous versus therapeutic  fam hx of htn, no fam hx of preeclampsia  Past Medical History:  Diagnosis Date  . Migraine     Patient Active Problem List   Diagnosis Date Noted  . Nausea and vomiting of pregnancy, antepartum 04/16/2016    Past Surgical History:  Procedure Laterality Date  . NO PAST SURGERIES      OB History    Gravida Para Term Preterm AB Living   2       1     SAB TAB Ectopic Multiple Live Births                   Home Medications    Prior to Admission medications   Medication Sig Start Date End Date Taking? Authorizing Provider  Prenatal Vit-Fe Fumarate-FA (PNV PRENATAL PLUS MULTIVITAMIN) 27-1 MG TABS Take 1 tablet by mouth daily. 03/24/16  Yes Historical Provider, MD  Butalbital-APAP-Caffeine (FIORICET) 50-300-40 MG CAPS Take 1 tablet by mouth every 8 (eight) hours as needed (headache). 07/23/16   Alvira MondayErin Leiah Giannotti, MD    Family History Family History  Problem Relation Age of Onset  . Rheum arthritis Mother   . Diabetes Father   . Thyroid disease Father     Social History Social History  Substance Use Topics  .  Smoking status: Never Smoker  . Smokeless tobacco: Never Used  . Alcohol use No     Allergies   Patient has no known allergies.   Review of Systems Review of Systems  Constitutional: Negative for fever.  HENT: Negative for sore throat.   Eyes: Negative for visual disturbance.  Respiratory: Negative for cough and shortness of breath.   Cardiovascular: Negative for chest pain.  Gastrointestinal: Positive for nausea. Negative for abdominal pain and vomiting.  Genitourinary: Negative for difficulty urinating, vaginal bleeding and vaginal discharge.  Musculoskeletal: Negative for back pain and neck pain.  Skin: Negative for rash.  Neurological: Positive for headaches. Negative for syncope.     Physical Exam Updated Vital Signs BP 99/76   Pulse 87   Temp 98.3 F (36.8 C) (Oral)   Resp 18   Ht 5\' 3"  (1.6 m)   Wt 155 lb (70.3 kg)   LMP 01/27/2016 (Approximate)   SpO2 100%   BMI 27.46 kg/m   Physical Exam  Constitutional: She is oriented to person, place, and time. She appears well-developed and well-nourished. No distress.  HENT:  Head: Normocephalic and atraumatic.  Eyes: Conjunctivae and EOM are normal.  Neck: Normal range of motion.  Cardiovascular: Normal rate, regular rhythm, normal heart sounds  and intact distal pulses.  Exam reveals no gallop and no friction rub.   No murmur heard. Pulmonary/Chest: Effort normal and breath sounds normal. No respiratory distress. She has no wheezes. She has no rales.  Abdominal: Soft. She exhibits no distension. There is no tenderness. There is no guarding.  Gravid   Musculoskeletal: She exhibits no edema or tenderness.  Neurological: She is alert and oriented to person, place, and time. She has normal strength. No cranial nerve deficit or sensory deficit. Coordination normal. GCS eye subscore is 4. GCS verbal subscore is 5. GCS motor subscore is 6.  Normal visual fields  Skin: Skin is warm and dry. No rash noted. She is not  diaphoretic. No erythema.  Nursing note and vitals reviewed.    ED Treatments / Results  Labs (all labs ordered are listed, but only abnormal results are displayed) Labs Reviewed  URINALYSIS, ROUTINE W REFLEX MICROSCOPIC - Abnormal; Notable for the following:       Result Value   APPearance HAZY (*)    All other components within normal limits  COMPREHENSIVE METABOLIC PANEL - Abnormal; Notable for the following:    Sodium 133 (*)    CO2 19 (*)    BUN <5 (*)    Calcium 8.3 (*)    Total Protein 5.8 (*)    Albumin 3.0 (*)    All other components within normal limits    EKG  EKG Interpretation None       Radiology No results found.  Procedures Procedures (including critical care time)  Medications Ordered in ED Medications  sodium chloride 0.9 % bolus 1,000 mL (0 mLs Intravenous Stopped 07/23/16 2320)  metoCLOPramide (REGLAN) injection 10 mg (10 mg Intravenous Given 07/23/16 2045)  diphenhydrAMINE (BENADRYL) injection 25 mg (25 mg Intravenous Given 07/23/16 2045)     Initial Impression / Assessment and Plan / ED Course  I have reviewed the triage vital signs and the nursing notes.  Pertinent labs & imaging results that were available during my care of the patient were reviewed by me and considered in my medical decision making (see chart for details).      25yo female G2P0010 at 25wk presents with concern for headache and found BP 130/103 at home.   Headache began slowly, no trauma, no fevers, and normal neurologic exam and have low suspicion for [facility], SDH or meningitis.  Pt with hx of migraines and this is photo/noise sensitive, and overall doubt venous sinus thrombosis.Patient was given reglan and benadryl with improvement in headache.  Blood pressure in ED 129/96 on arrival, however this was maximum, had one other diastolic of 90. Urinalysis without protein. Rapid OB evaluated, no OB concerns. Discussed with pt OBGYN regarding BP and no sign of preeclampsia at this time  however recommend close follow up. Gave rx for Fioricet. Patient discharged in stable condition with understanding of reasons to return.     Final Clinical Impressions(s) / ED Diagnoses   Final diagnoses:  Other migraine without status migrainosus, not intractable    New Prescriptions Discharge Medication List as of 07/23/2016 11:01 PM    START taking these medications   Details  Butalbital-APAP-Caffeine (FIORICET) 50-300-40 MG CAPS Take 1 tablet by mouth every 8 (eight) hours as needed (headache)., Starting Fri 07/23/2016, Print         Alvira Monday, MD 07/24/16 [phone]

## 2016-07-26 ENCOUNTER — Encounter: Payer: Self-pay | Admitting: Obstetrics and Gynecology

## 2016-07-26 ENCOUNTER — Ambulatory Visit (INDEPENDENT_AMBULATORY_CARE_PROVIDER_SITE_OTHER): Payer: BLUE CROSS/BLUE SHIELD | Admitting: Obstetrics and Gynecology

## 2016-07-26 VITALS — BP 121/81 | HR 111 | Wt 153.2 lb

## 2016-07-26 DIAGNOSIS — G43909 Migraine, unspecified, not intractable, without status migrainosus: Secondary | ICD-10-CM | POA: Insufficient documentation

## 2016-07-26 LAB — POCT URINALYSIS DIPSTICK
Bilirubin, UA: NEGATIVE
Blood, UA: NEGATIVE
Glucose, UA: NEGATIVE
Ketones, UA: NEGATIVE
Leukocytes, UA: NEGATIVE
Nitrite, UA: NEGATIVE
Protein, UA: NEGATIVE
Spec Grav, UA: 1.025
Urobilinogen, UA: NEGATIVE
pH, UA: 5

## 2016-07-26 MED ORDER — METOCLOPRAMIDE HCL 10 MG PO TABS: 10.0000 mg | ORAL_TABLET | Freq: Four times a day (QID) | ORAL | 1 refills | Status: DC

## 2016-07-26 NOTE — Progress Notes (Signed)
Est.pt is here with c/o "bad" headache, c/o sensitivity to light. Was seen in ED 07/23/16 for headache. Was given BUTALBITAL APAP Caff. With no relief. Today pts pain ia an "8"

## 2016-07-26 NOTE — Progress Notes (Signed)
HPI:      [name] is a 26 y.o. G2P0010 who LMP was Patient's last menstrual period was 01/27/2016 (approximate)..  Subjective: She presents today after being seen in the emergency department for migraine headache. She is approximately 26 weeks' estimated gestation. She received the migraine cocktail which helped her a little bit. She has taken Fioricet and this has not helped. She continues to experience photophobia. No nausea. She has a history of migraines but they have "never lasted this long "     Hx: The following portions of the patient's history were reviewed and updated as appropriate:            She  has a past medical history of Migraine. She has a current medication list which includes the following prescription(s): pnv prenatal plus multivitamin. Current Outpatient Prescriptions on File Prior to Visit  Medication Sig Dispense Refill  . Prenatal Vit-Fe Fumarate-FA (PNV PRENATAL PLUS MULTIVITAMIN) 27-1 MG TABS Take 1 tablet by mouth daily.  3   No current facility-administered medications on file prior to visit.           ROS: Constitutional: Denied constitutional symptoms, night sweats, recent illness, fatigue, fever, insomnia and weight loss.  Eyes: Denied eye symptoms, eye pain, photophobia, vision change and visual disturbance.  Ears/Nose/Throat/Neck: Denied ear, nose, throat or neck symptoms, hearing loss, nasal discharge, sinus congestion and sore throat.  Cardiovascular: Denied cardiovascular symptoms, arrhythmia, chest pain/pressure, edema, exercise intolerance, orthopnea and palpitations.  Respiratory: Denied pulmonary symptoms, asthma, pleuritic pain, productive sputum, cough, dyspnea and wheezing.  Gastrointestinal: Denied, gastro-esophageal reflux, melena, nausea and vomiting.  Genitourinary: Denied genitourinary symptoms including symptomatic vaginal discharge, pelvic relaxation issues, and urinary complaints.  Musculoskeletal: Denied musculoskeletal  symptoms, stiffness, swelling, muscle weakness and myalgia.  Dermatologic: Denied dermatology symptoms, rash and scar.  Neurologic: Migraine headache.  Psychiatric: Denied psychiatric symptoms, anxiety and depression.  Endocrine: Denied endocrine symptoms including hot flashes and night sweats.     Objective: Vitals:   07/26/16 1345  BP: 121/81  Pulse: (!) 111              Neck remains supple   Migraine without status migrainosus, not intractable, unspecified migraine type - Plan: POCT urinalysis dipstick   Plan:    Outpatient Encounter Prescriptions as of 07/26/2016  Medication Sig Note  . Prenatal Vit-Fe Fumarate-FA (PNV PRENATAL PLUS MULTIVITAMIN) 27-1 MG TABS Take 1 tablet by mouth daily. 04/15/2016: Received from: External Pharmacy Received Sig: TAKE ONE TABLET BY MOUTH DAILY  . [DISCONTINUED] Butalbital-APAP-Caffeine (FIORICET) 50-300-40 MG CAPS Take 1 tablet by mouth every 8 (eight) hours as needed (headache).    No facility-administered encounter medications on file as of 07/26/2016.          Orders Orders Placed This Encounter  Procedures  . POCT urinalysis dipstick    1.  Discussed multiple treatment options for migraine pregnancy. Reviewed medications that were safe versus medications that were unsafe. She will continue using Tylenol. Will try oral Reglan and Benadryl as a secondary option.        F/U  Return in about 2 weeks (around 08/09/2016).  Elonda Huskyavid J. Evans, M.D. 07/26/2016 2:48 PM

## 2016-07-30 ENCOUNTER — Telehealth: Payer: Self-pay | Admitting: Obstetrics and Gynecology

## 2016-08-02 NOTE — Telephone Encounter (Signed)
error 

## 2016-08-11 ENCOUNTER — Ambulatory Visit (INDEPENDENT_AMBULATORY_CARE_PROVIDER_SITE_OTHER): Payer: BLUE CROSS/BLUE SHIELD | Admitting: Obstetrics and Gynecology

## 2016-08-11 ENCOUNTER — Other Ambulatory Visit: Payer: BLUE CROSS/BLUE SHIELD

## 2016-08-11 VITALS — BP 127/80 | HR 92 | Wt 159.6 lb

## 2016-08-11 DIAGNOSIS — Z3482 Encounter for supervision of other normal pregnancy, second trimester: Secondary | ICD-10-CM

## 2016-08-11 DIAGNOSIS — Z130 Encounter for screening for diseases of the blood and blood-forming organs and certain disorders involving the immune mechanism: Secondary | ICD-10-CM

## 2016-08-11 DIAGNOSIS — Z131 Encounter for screening for diabetes mellitus: Secondary | ICD-10-CM

## 2016-08-11 DIAGNOSIS — Z3483 Encounter for supervision of other normal pregnancy, third trimester: Secondary | ICD-10-CM | POA: Diagnosis not present

## 2016-08-11 DIAGNOSIS — Z23 Encounter for immunization: Secondary | ICD-10-CM | POA: Diagnosis not present

## 2016-08-11 MED ORDER — TETANUS-DIPHTH-ACELL PERTUSSIS 5-2.5-18.5 LF-MCG/0.5 IM SUSP
0.5000 mL | Freq: Once | INTRAMUSCULAR | Status: AC
  Administered 2016-08-11: 0.5 mL via INTRAMUSCULAR

## 2016-08-11 NOTE — Progress Notes (Signed)
ROB: Doing well, no complaints. Notes headaches finally resolved after 1.5 weeks.  For 28 week labs today.  Desires to breastfeed, desires Depo Provera for contraception. For Tdap today, signed blood consent, discussed cord blood banking. RTC in 2 weeks.

## 2016-08-12 LAB — GLUCOSE, 1 HOUR GESTATIONAL: Gestational Diabetes Screen: 105 mg/dL (ref 65–139)

## 2016-08-12 LAB — HEMOGLOBIN AND HEMATOCRIT, BLOOD
Hematocrit: 39.8 % (ref 34.0–46.6)
Hemoglobin: 12.6 g/dL (ref 11.1–15.9)

## 2016-08-13 DIAGNOSIS — Z0289 Encounter for other administrative examinations: Secondary | ICD-10-CM

## 2016-08-25 ENCOUNTER — Encounter: Payer: Self-pay | Admitting: Obstetrics and Gynecology

## 2016-08-25 ENCOUNTER — Ambulatory Visit (INDEPENDENT_AMBULATORY_CARE_PROVIDER_SITE_OTHER): Payer: BLUE CROSS/BLUE SHIELD | Admitting: Obstetrics and Gynecology

## 2016-08-25 VITALS — BP 116/78 | HR 92 | Wt 160.3 lb

## 2016-08-25 DIAGNOSIS — Z3483 Encounter for supervision of other normal pregnancy, third trimester: Secondary | ICD-10-CM

## 2016-08-25 LAB — POCT URINALYSIS DIPSTICK
Bilirubin, UA: NEGATIVE
Blood, UA: NEGATIVE
Glucose, UA: NEGATIVE
Ketones, UA: NEGATIVE
Leukocytes, UA: NEGATIVE
Nitrite, UA: NEGATIVE
Protein, UA: NEGATIVE
Spec Grav, UA: <=1.005
Urobilinogen, UA: NEGATIVE
pH, UA: 5

## 2016-08-25 NOTE — Progress Notes (Signed)
Migraine headache resolved.    Feels kicks down low as opposed to up higher.  Daily fetal movement. No complaints. Possibly Breech by Leopold's.

## 2016-09-08 ENCOUNTER — Ambulatory Visit (INDEPENDENT_AMBULATORY_CARE_PROVIDER_SITE_OTHER): Payer: BLUE CROSS/BLUE SHIELD | Admitting: Obstetrics and Gynecology

## 2016-09-08 VITALS — BP 118/79 | HR 96 | Wt 162.5 lb

## 2016-09-08 DIAGNOSIS — Z3483 Encounter for supervision of other normal pregnancy, third trimester: Secondary | ICD-10-CM

## 2016-09-08 DIAGNOSIS — R030 Elevated blood-pressure reading, without diagnosis of hypertension: Secondary | ICD-10-CM

## 2016-09-08 LAB — POCT URINALYSIS DIPSTICK
Bilirubin, UA: NEGATIVE
Blood, UA: NEGATIVE
Glucose, UA: NEGATIVE
Ketones, UA: NEGATIVE
Nitrite, UA: NEGATIVE
Protein, UA: NEGATIVE
Spec Grav, UA: 1.015
Urobilinogen, UA: NEGATIVE
pH, UA: 8

## 2016-09-08 NOTE — Progress Notes (Signed)
ROB: Patient doing well, no complaints. Elevated BP today with normal repeat.  Discussed if continued elevations noted during pregnancy she may be trying to develop PIH. Discussed female circumcision.  Addressed patient's concerns regarding persistent breech presentation (noted again last week on outside 4-D sono). RTC in 2 weeks.

## 2016-09-22 ENCOUNTER — Ambulatory Visit (INDEPENDENT_AMBULATORY_CARE_PROVIDER_SITE_OTHER): Payer: BLUE CROSS/BLUE SHIELD | Admitting: Obstetrics and Gynecology

## 2016-09-22 ENCOUNTER — Encounter: Payer: Self-pay | Admitting: Obstetrics and Gynecology

## 2016-09-22 VITALS — BP 122/78 | HR 111 | Wt 163.3 lb

## 2016-09-22 DIAGNOSIS — Z3493 Encounter for supervision of normal pregnancy, unspecified, third trimester: Secondary | ICD-10-CM

## 2016-09-22 LAB — POCT URINALYSIS DIPSTICK
Bilirubin, UA: NEGATIVE
Blood, UA: NEGATIVE
Glucose, UA: NEGATIVE
Ketones, UA: NEGATIVE
Leukocytes, UA: NEGATIVE
Nitrite, UA: NEGATIVE
Protein, UA: NEGATIVE
Spec Grav, UA: 1.03 (ref 1.030–1.035)
Urobilinogen, UA: NEGATIVE (ref ?–2.0)
pH, UA: 5 (ref 5.0–8.0)

## 2016-09-22 NOTE — Progress Notes (Signed)
ROB: Baby palpates vertex. Patient without complaint.  BP's fine.  Any question regarding fetal position consider ultrasound  at @ 36 weeks.

## 2016-10-04 ENCOUNTER — Ambulatory Visit (INDEPENDENT_AMBULATORY_CARE_PROVIDER_SITE_OTHER): Payer: BLUE CROSS/BLUE SHIELD

## 2016-10-04 ENCOUNTER — Ambulatory Visit (INDEPENDENT_AMBULATORY_CARE_PROVIDER_SITE_OTHER): Payer: BLUE CROSS/BLUE SHIELD | Admitting: Certified Nurse Midwife

## 2016-10-04 ENCOUNTER — Encounter: Payer: BLUE CROSS/BLUE SHIELD | Admitting: Obstetrics and Gynecology

## 2016-10-04 VITALS — BP 107/83 | HR 86 | Wt 166.0 lb

## 2016-10-04 DIAGNOSIS — Z3493 Encounter for supervision of normal pregnancy, unspecified, third trimester: Secondary | ICD-10-CM

## 2016-10-04 DIAGNOSIS — Z3685 Encounter for antenatal screening for Streptococcus B: Secondary | ICD-10-CM

## 2016-10-04 DIAGNOSIS — Z113 Encounter for screening for infections with a predominantly sexual mode of transmission: Secondary | ICD-10-CM

## 2016-10-04 LAB — POCT URINALYSIS DIPSTICK
Bilirubin, UA: NEGATIVE
Blood, UA: NEGATIVE
Glucose, UA: NEGATIVE
Ketones, UA: NEGATIVE
Leukocytes, UA: NEGATIVE
Nitrite, UA: NEGATIVE
Protein, UA: NEGATIVE
Spec Grav, UA: 1.01 (ref 1.030–1.035)
Urobilinogen, UA: 0.2 (ref ?–2.0)
pH, UA: 6 (ref 5.0–8.0)

## 2016-10-04 NOTE — Progress Notes (Signed)
ROB- cultures obtained, pt is doing well 

## 2016-10-04 NOTE — Patient Instructions (Signed)

## 2016-10-04 NOTE — Progress Notes (Signed)
ROB, doing well no complaints. GBS collected today. Ultrasound scan for presentation - Vertex confirmed. SVE closed posterior, high. Labor precautions reviewed. ROB in one week.  ULTRASOUND REPORT  Location: [facility] Date of Service: 10/04/16  Indications: Presentation Findings:  Mason Jim intrauterine pregnancy is visualized with FHR at 115 BPM.   Fetal presentation is cephalic, spine anterior.  Placenta: Maternal left, grade 2. AFI: Subjectively adequate  Impression: 1. Fetal presentation is cephalic and spine is anterior.   Doreene Burke, CNM

## 2016-10-05 ENCOUNTER — Encounter: Payer: BLUE CROSS/BLUE SHIELD | Admitting: Obstetrics and Gynecology

## 2016-10-06 ENCOUNTER — Encounter: Payer: Self-pay | Admitting: Certified Nurse Midwife

## 2016-10-06 LAB — STREP GP B NAA: Strep Gp B NAA: POSITIVE — AB

## 2016-10-09 ENCOUNTER — Encounter: Payer: Self-pay | Admitting: Certified Nurse Midwife

## 2016-10-12 ENCOUNTER — Other Ambulatory Visit: Payer: Self-pay | Admitting: Certified Nurse Midwife

## 2016-10-12 ENCOUNTER — Encounter: Payer: BLUE CROSS/BLUE SHIELD | Admitting: Obstetrics and Gynecology

## 2016-10-12 ENCOUNTER — Ambulatory Visit (INDEPENDENT_AMBULATORY_CARE_PROVIDER_SITE_OTHER): Payer: BLUE CROSS/BLUE SHIELD | Admitting: Obstetrics and Gynecology

## 2016-10-12 VITALS — BP 115/72 | HR 102 | Wt 168.2 lb

## 2016-10-12 DIAGNOSIS — Z3493 Encounter for supervision of normal pregnancy, unspecified, third trimester: Secondary | ICD-10-CM

## 2016-10-12 DIAGNOSIS — O99820 Streptococcus B carrier state complicating pregnancy: Secondary | ICD-10-CM

## 2016-10-12 LAB — POCT URINALYSIS DIPSTICK
Bilirubin, UA: NEGATIVE
Blood, UA: NEGATIVE
Glucose, UA: NEGATIVE
Ketones, UA: NEGATIVE
Leukocytes, UA: NEGATIVE
Nitrite, UA: NEGATIVE
Protein, UA: NEGATIVE
Spec Grav, UA: 1.025 (ref 1.030–1.035)
Urobilinogen, UA: 0.2 (ref ?–2.0)
pH, UA: 6 (ref 5.0–8.0)

## 2016-10-12 NOTE — Progress Notes (Signed)
ROB: Doing well, notes some pressure, denies contractions. Discussed expectations during labor, pain management. GBS positive, discussed need for antibiotics in labor.  Will perform GC/Cl as not performed last visit. Labor precautions given.  RTC in 1 week.

## 2016-10-12 NOTE — Progress Notes (Signed)
ROB- pt is doing well, having some pelvic pressure 

## 2016-10-14 ENCOUNTER — Telehealth: Payer: Self-pay | Admitting: Obstetrics and Gynecology

## 2016-10-14 LAB — GC/CHLAMYDIA PROBE AMP
Chlamydia trachomatis, NAA: NEGATIVE
Neisseria gonorrhoeae by PCR: NEGATIVE

## 2016-10-14 NOTE — Telephone Encounter (Signed)
Called pt she states that she has had swelling in her hands since last night, has not improved any. Pt is unable to make a fist, notes that her hands feel as though they have pins in them and are very achy. Pt states she has pushed fluids however is very concerned. Please advise

## 2016-10-14 NOTE — Telephone Encounter (Signed)
Please inform patient that it sounds like carpal tunnel syndrome, which is common in 3rd trimester. Advise on hand brace during the day.

## 2016-10-14 NOTE — Telephone Encounter (Signed)
Patient called stating she is having abdominal cramping and woke up with her hands swollen and achey. Please Advise.

## 2016-10-14 NOTE — Telephone Encounter (Signed)
Patie

## 2016-10-15 NOTE — Telephone Encounter (Signed)
Called pt informed her of information below.  

## 2016-10-21 ENCOUNTER — Encounter: Payer: Self-pay | Admitting: Obstetrics and Gynecology

## 2016-10-21 ENCOUNTER — Ambulatory Visit (INDEPENDENT_AMBULATORY_CARE_PROVIDER_SITE_OTHER): Payer: BLUE CROSS/BLUE SHIELD | Admitting: Obstetrics and Gynecology

## 2016-10-21 VITALS — BP 121/79 | HR 81 | Wt 167.2 lb

## 2016-10-21 DIAGNOSIS — Z3493 Encounter for supervision of normal pregnancy, unspecified, third trimester: Secondary | ICD-10-CM

## 2016-10-21 LAB — POCT URINALYSIS DIPSTICK
Bilirubin, UA: NEGATIVE
Blood, UA: NEGATIVE
Glucose, UA: NEGATIVE
Ketones, UA: NEGATIVE
Leukocytes, UA: NEGATIVE
Nitrite, UA: NEGATIVE
Protein, UA: NEGATIVE
Spec Grav, UA: >=1.03 — AB (ref 1.010–1.025)
Urobilinogen, UA: NEGATIVE E.U./dL — AB
pH, UA: 5 (ref 5.0–8.0)

## 2016-10-21 NOTE — Progress Notes (Signed)
ROB:  Doing well.  GBS discussed.  S&S labor discussed.  See cervix above.  1 wk

## 2016-10-27 ENCOUNTER — Inpatient Hospital Stay
Admission: EM | Admit: 2016-10-27 | Discharge: 2016-10-30 | DRG: 775 | Disposition: A | Discharge status: Home / Self Care | Payer: BLUE CROSS/BLUE SHIELD | Attending: Obstetrics and Gynecology | Admitting: Obstetrics and Gynecology

## 2016-10-27 ENCOUNTER — Ambulatory Visit (INDEPENDENT_AMBULATORY_CARE_PROVIDER_SITE_OTHER): Payer: BLUE CROSS/BLUE SHIELD | Admitting: Obstetrics and Gynecology

## 2016-10-27 VITALS — BP 119/89 | HR 93 | Wt 168.8 lb

## 2016-10-27 DIAGNOSIS — Z3483 Encounter for supervision of other normal pregnancy, third trimester: Secondary | ICD-10-CM

## 2016-10-27 DIAGNOSIS — Z3A39 39 weeks gestation of pregnancy: Secondary | ICD-10-CM

## 2016-10-27 DIAGNOSIS — Z3493 Encounter for supervision of normal pregnancy, unspecified, third trimester: Secondary | ICD-10-CM | POA: Diagnosis present

## 2016-10-27 DIAGNOSIS — O99824 Streptococcus B carrier state complicating childbirth: Secondary | ICD-10-CM | POA: Diagnosis present

## 2016-10-27 DIAGNOSIS — O4202 Full-term premature rupture of membranes, onset of labor within 24 hours of rupture: Principal | ICD-10-CM | POA: Diagnosis present

## 2016-10-27 DIAGNOSIS — IMO0002 Reserved for concepts with insufficient information to code with codable children: Secondary | ICD-10-CM | POA: Diagnosis present

## 2016-10-27 DIAGNOSIS — Z833 Family history of diabetes mellitus: Secondary | ICD-10-CM | POA: Diagnosis not present

## 2016-10-27 DIAGNOSIS — O479 False labor, unspecified: Secondary | ICD-10-CM

## 2016-10-27 DIAGNOSIS — O42019 Preterm premature rupture of membranes, onset of labor within 24 hours of rupture, unspecified trimester: Secondary | ICD-10-CM | POA: Diagnosis not present

## 2016-10-27 LAB — CBC
HCT: 41.1 % (ref 35.0–47.0)
Hemoglobin: 13.9 g/dL (ref 12.0–16.0)
MCH: 30.4 pg (ref 26.0–34.0)
MCHC: 33.9 g/dL (ref 32.0–36.0)
MCV: 89.7 fL (ref 80.0–100.0)
Platelets: 191 10*3/uL (ref 150–440)
RBC: 4.58 MIL/uL (ref 3.80–5.20)
RDW: 14 % (ref 11.5–14.5)
WBC: 16.9 10*3/uL — ABNORMAL HIGH (ref 3.6–11.0)

## 2016-10-27 LAB — POCT URINALYSIS DIPSTICK
Bilirubin, UA: NEGATIVE
Glucose, UA: NEGATIVE
Ketones, UA: NEGATIVE
Nitrite, UA: NEGATIVE
Spec Grav, UA: 1.015 (ref 1.010–1.025)
Urobilinogen, UA: 0.2 E.U./dL
pH, UA: 6.5 (ref 5.0–8.0)

## 2016-10-27 MED ORDER — LIDOCAINE HCL (PF) 1 % IJ SOLN: 30.0000 mL | INTRAMUSCULAR | Status: DC | PRN

## 2016-10-27 MED ORDER — OXYCODONE-ACETAMINOPHEN 5-325 MG PO TABS: 2.0000 | ORAL_TABLET | ORAL | Status: DC | PRN

## 2016-10-27 MED ORDER — MISOPROSTOL 200 MCG PO TABS
ORAL_TABLET | ORAL | Status: AC
  Filled 2016-10-27: qty 4

## 2016-10-27 MED ORDER — FENTANYL 2.5 MCG/ML W/ROPIVACAINE 0.2% IN NS 100 ML EPIDURAL INFUSION (ARMC-ANES)
EPIDURAL | Status: AC
  Filled 2016-10-27: qty 100

## 2016-10-27 MED ORDER — OXYTOCIN 40 UNITS IN LACTATED RINGERS INFUSION - SIMPLE MED
2.5000 [IU]/h | INTRAVENOUS | Status: DC
  Administered 2016-10-28: 2.5 [IU]/h via INTRAVENOUS

## 2016-10-27 MED ORDER — LIDOCAINE HCL (PF) 1 % IJ SOLN
INTRAMUSCULAR | Status: AC
  Filled 2016-10-27: qty 30

## 2016-10-27 MED ORDER — OXYCODONE-ACETAMINOPHEN 5-325 MG PO TABS: 1.0000 | ORAL_TABLET | ORAL | Status: DC | PRN

## 2016-10-27 MED ORDER — SODIUM CHLORIDE 0.9 % IV SOLN
1.0000 g | INTRAVENOUS | Status: DC
Start: 2016-10-28 — End: 2016-10-28
  Filled 2016-10-27 (×3): qty 1000

## 2016-10-27 MED ORDER — OXYTOCIN 10 UNIT/ML IJ SOLN
INTRAMUSCULAR | Status: AC
  Filled 2016-10-27: qty 2

## 2016-10-27 MED ORDER — LACTATED RINGERS IV SOLN: 500.0000 mL | INTRAVENOUS | Status: DC | PRN

## 2016-10-27 MED ORDER — LACTATED RINGERS IV SOLN: INTRAVENOUS | Status: DC

## 2016-10-27 MED ORDER — OXYTOCIN BOLUS FROM INFUSION
500.0000 mL | Freq: Once | INTRAVENOUS | Status: AC
  Administered 2016-10-28: 500 mL via INTRAVENOUS

## 2016-10-27 MED ORDER — SOD CITRATE-CITRIC ACID 500-334 MG/5ML PO SOLN
30.0000 mL | ORAL | Status: DC | PRN
  Filled 2016-10-27: qty 30

## 2016-10-27 MED ORDER — OXYTOCIN 40 UNITS IN LACTATED RINGERS INFUSION - SIMPLE MED
INTRAVENOUS | Status: AC
  Administered 2016-10-28: 500 mL via INTRAVENOUS
  Filled 2016-10-27: qty 1000

## 2016-10-27 MED ORDER — BUTORPHANOL TARTRATE 1 MG/ML IJ SOLN: 1.0000 mg | INTRAMUSCULAR | Status: DC | PRN

## 2016-10-27 MED ORDER — ACETAMINOPHEN 325 MG PO TABS: 650.0000 mg | ORAL_TABLET | ORAL | Status: DC | PRN

## 2016-10-27 MED ORDER — AMMONIA AROMATIC IN INHA
RESPIRATORY_TRACT | Status: AC
  Filled 2016-10-27: qty 10

## 2016-10-27 MED ORDER — ONDANSETRON HCL 4 MG/2ML IJ SOLN: 4.0000 mg | Freq: Four times a day (QID) | INTRAMUSCULAR | Status: DC | PRN

## 2016-10-27 MED ORDER — SODIUM CHLORIDE 0.9 % IV SOLN
2.0000 g | Freq: Once | INTRAVENOUS | Status: AC
  Administered 2016-10-27: 2 g via INTRAVENOUS
  Filled 2016-10-27: qty 2000

## 2016-10-27 NOTE — OB Triage Note (Signed)
Patient arrived to labor and delivery with complaints of contractions getting progressively painful throughout the day.  Patient states her water broke about 15 mins prior to her arrival at the hospital. Small amount of fluid visualized, but none leaking or pooling on pad. Nitrazine negative. SVE as charted. Dr. Valentino Saxon notified of patient's arrival and appears to be very uncomfortable. Orders received to observe patient and reheck in 1 hour.

## 2016-10-27 NOTE — Progress Notes (Signed)
ROB: Notes increased contractions somewhat painful but resolved after 8 hrs last night.  Reiterated labor precautions. RTC in 1 week.

## 2016-10-28 ENCOUNTER — Encounter: Payer: Self-pay | Admitting: *Deleted

## 2016-10-28 DIAGNOSIS — O42019 Preterm premature rupture of membranes, onset of labor within 24 hours of rupture, unspecified trimester: Secondary | ICD-10-CM

## 2016-10-28 DIAGNOSIS — Z3A39 39 weeks gestation of pregnancy: Secondary | ICD-10-CM

## 2016-10-28 LAB — RAPID HIV SCREEN (HIV 1/2 AB+AG)
HIV 1/2 Antibodies: NONREACTIVE
HIV-1 P24 Antigen - HIV24: NONREACTIVE

## 2016-10-28 LAB — CBC
HCT: 37.4 % (ref 35.0–47.0)
Hemoglobin: 12.6 g/dL (ref 12.0–16.0)
MCH: 30.3 pg (ref 26.0–34.0)
MCHC: 33.7 g/dL (ref 32.0–36.0)
MCV: 89.8 fL (ref 80.0–100.0)
Platelets: 166 10*3/uL (ref 150–440)
RBC: 4.17 MIL/uL (ref 3.80–5.20)
RDW: 14.1 % (ref 11.5–14.5)
WBC: 18.7 10*3/uL — ABNORMAL HIGH (ref 3.6–11.0)

## 2016-10-28 LAB — TYPE AND SCREEN
ABO/RH(D): B POS
Antibody Screen: NEGATIVE

## 2016-10-28 MED ORDER — MEDROXYPROGESTERONE ACETATE 150 MG/ML IM SUSP
150.0000 mg | Freq: Once | INTRAMUSCULAR | Status: AC
Start: 2016-10-29 — End: 2016-10-30
  Administered 2016-10-30: 150 mg via INTRAMUSCULAR
  Filled 2016-10-28: qty 1

## 2016-10-28 MED ORDER — ZOLPIDEM TARTRATE 5 MG PO TABS: 5.0000 mg | ORAL_TABLET | Freq: Every evening | ORAL | Status: DC | PRN

## 2016-10-28 MED ORDER — DIBUCAINE 1 % RE OINT: 1.0000 "application " | TOPICAL_OINTMENT | RECTAL | Status: DC | PRN

## 2016-10-28 MED ORDER — IBUPROFEN 600 MG PO TABS
600.0000 mg | ORAL_TABLET | Freq: Four times a day (QID) | ORAL | Status: DC
  Administered 2016-10-28 – 2016-10-30 (×10): 600 mg via ORAL
  Filled 2016-10-28 (×9): qty 1

## 2016-10-28 MED ORDER — IBUPROFEN 600 MG PO TABS
ORAL_TABLET | ORAL | Status: AC
  Administered 2016-10-28: 600 mg via ORAL
  Filled 2016-10-28: qty 1

## 2016-10-28 MED ORDER — ACETAMINOPHEN 325 MG PO TABS: 650.0000 mg | ORAL_TABLET | ORAL | Status: DC | PRN

## 2016-10-28 MED ORDER — ONDANSETRON HCL 4 MG/2ML IJ SOLN: 4.0000 mg | INTRAMUSCULAR | Status: DC | PRN

## 2016-10-28 MED ORDER — WITCH HAZEL-GLYCERIN EX PADS: 1.0000 "application " | MEDICATED_PAD | CUTANEOUS | Status: DC | PRN

## 2016-10-28 MED ORDER — ONDANSETRON HCL 4 MG PO TABS: 4.0000 mg | ORAL_TABLET | ORAL | Status: DC | PRN

## 2016-10-28 MED ORDER — COCONUT OIL OIL: 1.0000 "application " | TOPICAL_OIL | Status: DC | PRN

## 2016-10-28 MED ORDER — SIMETHICONE 80 MG PO CHEW: 80.0000 mg | CHEWABLE_TABLET | ORAL | Status: DC | PRN

## 2016-10-28 MED ORDER — DIPHENHYDRAMINE HCL 25 MG PO CAPS: 25.0000 mg | ORAL_CAPSULE | Freq: Four times a day (QID) | ORAL | Status: DC | PRN

## 2016-10-28 MED ORDER — PRENATAL MULTIVITAMIN CH
1.0000 | ORAL_TABLET | Freq: Every day | ORAL | Status: DC
  Administered 2016-10-28 – 2016-10-29 (×2): 1 via ORAL
  Filled 2016-10-28 (×2): qty 1

## 2016-10-28 MED ORDER — BENZOCAINE-MENTHOL 20-0.5 % EX AERO: 1.0000 "application " | INHALATION_SPRAY | CUTANEOUS | Status: DC | PRN

## 2016-10-28 MED ORDER — HYDROCODONE-ACETAMINOPHEN 5-325 MG PO TABS: 1.0000 | ORAL_TABLET | ORAL | Status: DC | PRN

## 2016-10-28 MED ORDER — SENNOSIDES-DOCUSATE SODIUM 8.6-50 MG PO TABS
2.0000 | ORAL_TABLET | ORAL | Status: DC
Start: 2016-10-29 — End: 2016-10-30
  Administered 2016-10-28 – 2016-10-29 (×2): 2 via ORAL
  Filled 2016-10-28 (×2): qty 2

## 2016-10-28 NOTE — Discharge Summary (Signed)
Obstetric Discharge Summary Reason for Admission: onset of labor and rupture of membranes Prenatal Procedures: ultrasound Intrapartum Procedures: spontaneous vaginal delivery (precipitous) Postpartum Procedures: none Complications-Operative and Postpartum: none Hemoglobin  Date Value Ref Range Status  10/27/2016 13.9 12.0 - 16.0 g/dL Final   HCT  Date Value Ref Range Status  10/27/2016 41.1 35.0 - 47.0 % Final   Hematocrit  Date Value Ref Range Status  08/11/2016 39.8 34.0 - 46.6 % Final    Physical Exam:  General: alert and no distress Lochia: appropriate Uterine Fundus: firm Incision: none DVT Evaluation: No evidence of DVT seen on physical exam. Negative Homan's sign. No cords or calf tenderness. No significant calf/ankle edema.  Discharge Diagnoses: Term Pregnancy-delivered (precipitous)  Discharge Information: Date: 10/28/2016 Activity: pelvic rest Diet: routine Medications: PNV and Ibuprofen Condition: stable Instructions: refer to practice specific booklet Discharge to: home Follow-up Information    Hildred Laser, MD Follow up in 6 week(s).   Specialties:  Obstetrics and Gynecology, Radiology Why:  Postpartum visit Contact information: [address] [place] [address] [phone]           Newborn Data: Live born female  Birth Weight: 5 lb 12.1 oz (2610 g) APGAR: 8, 9  Home with mother.  Hildred Laser 10/28/2016, 10:47 AM

## 2016-10-28 NOTE — Progress Notes (Signed)
Post Partum Day # 0, s/p SVD  Subjective: no complaints, up ad lib, voiding and tolerating PO  Objective: Temp:  [98.2 F (36.8 C)-99.2 F (37.3 C)] 98.2 F (36.8 C) (04/26 0708) Pulse Rate:  [84-135] 84 (04/26 0708) Resp:  [18-20] 20 (04/26 0708) BP: (106-153)/(69-118) 106/69 (04/26 0708) SpO2:  [97 %-98 %] 98 % (04/26 0708) Weight:  [168 lb (76.2 kg)-168 lb 12.8 oz (76.6 kg)] 168 lb (76.2 kg) (04/26 0242)  Physical Exam:  General: alert and no distress  Lungs: clear to auscultation bilaterally Breasts: normal appearance, no masses or tenderness Heart: regular rate and rhythm, S1, S2 normal, no murmur, click, rub or gallop Abdomen: soft, non-tender; bowel sounds normal; no masses,  no organomegaly Pelvis: Lochia: appropriate, Uterine Fundus: firm Extremities: DVT Evaluation: No evidence of DVT seen on physical exam. Negative Homan's sign.  No cords or calf tenderness. No significant calf/ankle edema.   Recent Labs  10/27/16 2311  HGB 13.9  HCT 41.1    Assessment/Plan: Doing well postpartum.  Breastfeeding, for Lactation consult  Circumcision prior to discharge  Contraception Depo Provera, to be given prior to discharge  Pending postpartum CBC (due at 2 pm).  Plan for discharge in 1-2 days.    LOS: 1 day   Hildred Laser, MD [facility] 10/28/2016 10:43 AM

## 2016-10-28 NOTE — Progress Notes (Addendum)
Monitor tracing maternal vs fetal heart tones at times. RN remains at bedside. Difficulty determining frequency and duration of contractions on toco. Constant maternal movement due to pt coping with contractions/pressure.  EFM d/c'd at 2251 for patient to go to the bathroom. RN remains at bedside to assist patient.

## 2016-10-28 NOTE — H&P (Signed)
Obstetric History and Physical  ANISHA STARLIPER is a 26 y.o. G2P1010 with IUP at [redacted]w[redacted]d presenting for complaints of contractions and possible LOF (starting ~ 9:45 p.m., small trickle, clear). Patient states she has been having  regular, every 3-4 minutes contractions, none vaginal bleeding,  with active fetal movement.   Prenatal Course Source of Care: [facility] with onset of care at 11 weeks Pregnancy complications or risks: Patient Active Problem List   Diagnosis Date Noted  . PROM (premature rupture of membranes) 10/27/2016  . GBS (group B Streptococcus carrier), +RV culture, currently pregnant 10/12/2016  . Migraine without status migrainosus, not intractable 07/26/2016  . Nausea and vomiting of pregnancy, antepartum 04/16/2016   She plans to bottle feed She desires Depo-Provera for postpartum contraception.   Prenatal labs and studies: ABO, Rh: B/Positive/-- (10/12 1602) Antibody: Negative (10/12 1602) Rubella: 3.83 (10/12 1602) RPR: Non Reactive (10/12 1602)  HBsAg: Negative (10/12 1602)  HIV: Non Reactive (10/12 1602)  [id number] (04/02 1632) 1 hr Glucola  Normal (105) Genetic screening normal Anatomy US normal   Past Medical History:  Diagnosis Date  . Migraine     Past Surgical History:  Procedure Laterality Date  . NO PAST SURGERIES      OB History  Gravida Para Term Preterm AB Living  SAB TAB Ectopic Multiple Live Births        0      # Outcome Date GA Lbr Len/2nd Weight Sex Delivery Anes PTL Lv  2 Term 10/28/16 [redacted]w[redacted]d / 00:23  M      1 AB 2008              Social History   Social History  . Marital status: Single    Spouse name: N/A  . Number of children: N/A  . Years of education: N/A   Social History Main Topics  . Smoking status: Never Smoker  . Smokeless tobacco: Never Used  . Alcohol use No  . Drug use: No  . Sexual activity: Yes     Comment: Pregnancy    Other Topics Concern  . None   Social History  Narrative  . None    Family History  Problem Relation Age of Onset  . Rheum arthritis Mother   . Diabetes Father   . Thyroid disease Father     Prescriptions Prior to Admission  Medication Sig Dispense Refill Last Dose  . Prenatal Vit-Fe Fumarate-FA (PNV PRENATAL PLUS MULTIVITAMIN) 27-1 MG TABS Take 1 tablet by mouth daily.  3 10/27/2016 at 0800  . Butalbital-APAP-Caffeine 50-300-40 MG CAPS TAKE 1 TABLET BY MOUTH EVERY 8 HOURS AS NEEDED (HEADACHE)  0 Not Taking at Unknown time    No Known Allergies  Review of Systems: Negative except for what is mentioned in HPI.  Physical Exam: BP (!) 153/118   Pulse (!) 135   Ht  (1.6 m)   Wt 168 lb (76.2 kg)   LMP 01/27/2016 (Approximate)   Breastfeeding? Unknown   BMI 29.76 kg/m  CONSTITUTIONAL: Well-developed, well-nourished female in no acute distress.  HENT:  Normocephalic, atraumatic, External right and left ear normal. Oropharynx is clear and moist EYES: Conjunctivae and EOM are normal. Pupils are equal, round, and reactive to light. No scleral icterus.  NECK: Normal range of motion, supple, no masses SKIN: Skin is warm and dry. No rash noted. Not diaphoretic. No erythema. No pallor. NEUROLOGIC: Alert and oriented to person,  place, and time. Normal reflexes, muscle tone coordination. No cranial nerve deficit noted. PSYCHIATRIC: Normal mood and affect. Normal behavior. Normal judgment and thought content. CARDIOVASCULAR: Normal heart rate noted, regular rhythm RESPIRATORY: Effort and breath sounds normal, no problems with respiration noted ABDOMEN: Soft, nontender, nondistended, gravid. MUSCULOSKELETAL: Normal range of motion. No edema and no tenderness. 2+ distal pulses.  Cervical Exam: Dilatation 5cm   Effacement 90%   Station +1  (changed from 3/90/0 1 hr prior).  Nitrazine negative Presentation: cephalic FHT:  Baseline rate 120 bpm   Variability moderate  Accelerations present   Decelerations none Contractions: Every 3  mins   Pertinent Labs/Studies:   Results for orders placed or performed during the hospital encounter of 10/27/16 (from the past 24 hour(s))  CBC     Status: Abnormal   Collection Time: 10/27/16 11:11 PM  Result Value Ref Range   WBC 16.9 (H) 3.6 - 11.0 K/uL   RBC 4.58 3.80 - 5.20 MIL/uL   Hemoglobin 13.9 12.0 - 16.0 g/dL   HCT 40.9 81.1 - 91.4 %   MCV 89.7 80.0 - 100.0 fL   MCH 30.4 26.0 - 34.0 pg   MCHC 33.9 32.0 - 36.0 g/dL   RDW 78.2 95.6 - 21.3 %   Platelets 191 150 - 440 K/uL  Rapid HIV screen (HIV 1/2 Ab+Ag) ([facility] Only)     Status: None   Collection Time: 10/27/16 11:11 PM  Result Value Ref Range   HIV-1 P24 Antigen - HIV24 NON REACTIVE NON REACTIVE   HIV 1/2 Antibodies NON REACTIVE NON REACTIVE   Interpretation (HIV Ag Ab)      A non reactive test result means that HIV 1 or HIV 2 antibodies and HIV 1 p24 antigen were not detected in the specimen.    Assessment : LEYDA VANDERWERF is a 26 y.o. G2P1010 at [redacted]w[redacted]d being admitted for labor, possible ROM?Marland Kitchen  Plan: Labor: Expectant management.  Augmentation as needed, per protocol FWB: Reassuring fetal heart tracing.  GBS positive.  Will treat with Ampicillin.  Delivery plan: Hopeful for vaginal delivery.    Hildred Laser, MD [facility]

## 2016-10-28 NOTE — Discharge Instructions (Signed)

## 2016-10-29 LAB — RPR: RPR Ser Ql: NONREACTIVE

## 2016-10-30 NOTE — Progress Notes (Signed)
Reviewed all patients discharge instructions and handouts regarding postpartum bleeding, no intercourse for 6 weeks, signs and symptoms of mastitis and postpartum bleu's. Reviewed discharge instructions for newborn regarding proper cord care, how and when to bathe the newborn, nail care, proper way to take the baby's temperature, along with safe sleep. All questions have been answered at this time. Patient discharged via wheelchair with axillary.  

## 2016-11-03 ENCOUNTER — Encounter: Payer: BLUE CROSS/BLUE SHIELD | Admitting: Obstetrics and Gynecology

## 2016-12-09 ENCOUNTER — Encounter: Payer: Self-pay | Admitting: Obstetrics and Gynecology

## 2016-12-09 ENCOUNTER — Ambulatory Visit (INDEPENDENT_AMBULATORY_CARE_PROVIDER_SITE_OTHER): Payer: BLUE CROSS/BLUE SHIELD | Admitting: Obstetrics and Gynecology

## 2016-12-09 DIAGNOSIS — Z789 Other specified health status: Secondary | ICD-10-CM

## 2016-12-09 MED ORDER — MEDROXYPROGESTERONE ACETATE 150 MG/ML IM SUSP: 150.0000 mg | INTRAMUSCULAR | 3 refills | Status: DC

## 2016-12-09 NOTE — Progress Notes (Signed)
   OBSTETRICS POSTPARTUM CLINIC PROGRESS NOTE  Subjective:     Emily Dean is a 26 y.o. 432P1011 female who presents for a postpartum visit. She is 6 weeks postpartum following a spontaneous vaginal delivery. I have fully reviewed the prenatal and intrapartum course. The delivery was at 39 gestational weeks.  Anesthesia: none. Postpartum course has been well. Baby's course has been well Baby is feeding by bottle - Similac Advance. Bleeding: patient has not resumed menses, with No LMP recorded.. Bowel function is normal. Bladder function is normal. Patient is not sexually active. Contraception method desired is Depo-Provera injections (received initial dose during postpartum hospitalization). Postpartum depression screening: Negative, score 4.  The following portions of the patient's history were reviewed and updated as appropriate: allergies, current medications, past family history, past medical history, past social history, past surgical history and problem list.  Review of Systems Pertinent items noted in HPI and remainder of comprehensive ROS otherwise negative.   Objective:    BP 114/75 (BP Location: Left Arm, Patient Position: Sitting, Cuff Size: Normal)   Pulse 86   Ht 5\' 3"  (1.6 m)   Wt 157 lb 9.6 oz (71.5 kg)   Breastfeeding? No   BMI 27.92 kg/m   General:  alert and no distress   Breasts:  inspection negative, no nipple discharge or bleeding, no masses or nodularity palpable  Lungs: clear to auscultation bilaterally  Heart:  regular rate and rhythm, S1, S2 normal, no murmur, click, rub or gallop  Abdomen: soft, non-tender; bowel sounds normal; no masses,  no organomegaly.     Vulva:  normal  Vagina: normal vagina, no discharge, exudate, lesion, or erythema  Cervix:  no cervical motion tenderness and no lesions  Corpus: normal size, contour, position, consistency, mobility, non-tender  Adnexa:  normal adnexa and no mass, fullness, tenderness  Rectal Exam: Not performed.           Labs:  Lab Results  Component Value Date   HGB 12.6 10/28/2016    Assessment:   Routine postpartum exam s/p SVD.   Contraception management  Plan:   1. Contraception: Depo-Provera injections.  Patient to return in 6  weeks for next injection.  2. Follow up in: 4-6 months for annual exam, or as needed. Patient [facility] but desires to continue care at [facility].   Hildred Laserherry, Jahmeer Porche, MD [facility]

## 2016-12-09 NOTE — Patient Instructions (Signed)

## 2017-01-21 ENCOUNTER — Ambulatory Visit (INDEPENDENT_AMBULATORY_CARE_PROVIDER_SITE_OTHER): Payer: BLUE CROSS/BLUE SHIELD | Admitting: Obstetrics and Gynecology

## 2017-01-21 VITALS — BP 114/81 | HR 81 | Wt 157.8 lb

## 2017-01-21 DIAGNOSIS — Z789 Other specified health status: Secondary | ICD-10-CM

## 2017-01-21 MED ORDER — MEDROXYPROGESTERONE ACETATE 150 MG/ML IM SUSP
150.0000 mg | Freq: Once | INTRAMUSCULAR | Status: AC
  Administered 2017-01-21: 150 mg via INTRAMUSCULAR

## 2017-01-21 NOTE — Progress Notes (Signed)
I have reviewed the record and concur with patient management and plan. Continue q 3 month injections.  Hildred Laserherry, Maleeya Peterkin, MD [facility]

## 2017-01-21 NOTE — Progress Notes (Signed)
Patient ID: Emily Dean, female   DOB: 07/27/1990, 26 y.o.   MRN: [id number] Pt presents for her Depo-Provera (Medroxyprogesterone) injection. No c/o side effects. Had vaginal bleeding x6 wks after delivery and none since.  Last annual visit: 04/15/2016 NOB PE Last Injection given: [facility]/2016 [facility] Depo-Provera 150 mg IM given today: RUOQ buttocks Next injection due: 10/5 thru 04/22/2017

## 2017-04-11 ENCOUNTER — Ambulatory Visit (INDEPENDENT_AMBULATORY_CARE_PROVIDER_SITE_OTHER): Payer: BLUE CROSS/BLUE SHIELD | Admitting: Obstetrics and Gynecology

## 2017-04-11 VITALS — BP 120/87 | HR 77 | Wt 155.5 lb

## 2017-04-11 DIAGNOSIS — Z789 Other specified health status: Secondary | ICD-10-CM | POA: Diagnosis not present

## 2017-04-11 MED ORDER — MEDROXYPROGESTERONE ACETATE 150 MG/ML IM SUSP
150.0000 mg | Freq: Once | INTRAMUSCULAR | Status: AC
  Administered 2017-04-11: 150 mg via INTRAMUSCULAR

## 2017-04-11 NOTE — Progress Notes (Signed)
Patient ID: Emily Dean, female   DOB: 03/03/1991, 26 y.o.   MRN: [id number] Pt presents for depo-provera injection without any undesirable side effects.

## 2017-04-18 DIAGNOSIS — Z789 Other specified health status: Secondary | ICD-10-CM | POA: Insufficient documentation

## 2017-06-14 ENCOUNTER — Encounter: Payer: BLUE CROSS/BLUE SHIELD | Admitting: Obstetrics and Gynecology

## 2017-06-19 ENCOUNTER — Encounter (HOSPITAL_COMMUNITY): Payer: Self-pay | Admitting: *Deleted

## 2017-06-19 ENCOUNTER — Other Ambulatory Visit: Payer: Self-pay

## 2017-06-19 ENCOUNTER — Ambulatory Visit (HOSPITAL_COMMUNITY)
Admission: EM | Admit: 2017-06-19 | Discharge: 2017-06-19 | Disposition: A | Discharge status: Home / Self Care | Payer: BLUE CROSS/BLUE SHIELD | Attending: Internal Medicine | Admitting: Internal Medicine

## 2017-06-19 DIAGNOSIS — J209 Acute bronchitis, unspecified: Secondary | ICD-10-CM

## 2017-06-19 MED ORDER — ALBUTEROL SULFATE HFA 108 (90 BASE) MCG/ACT IN AERS
1.0000 | INHALATION_SPRAY | Freq: Four times a day (QID) | RESPIRATORY_TRACT | 0 refills | Status: DC | PRN
Start: 2017-06-19 — End: 2018-08-01

## 2017-06-19 MED ORDER — PREDNISONE 20 MG PO TABS: 40.0000 mg | ORAL_TABLET | Freq: Every day | ORAL | 0 refills | Status: AC

## 2017-06-19 NOTE — ED Provider Notes (Addendum)
[facility]    CSN: [id number] Arrival date & time: 06/19/17  1442     History   Chief Complaint Chief Complaint  Patient presents with  . Cough    HPI Emily Dean is a 26 y.o. female.   Sheryle HailDiamond presents with complaints of persistent cough for approximately 1 week. She was seen by her PCP 11/7 and was given azithromycin, tessalon and albuterol inhaler. Feels like her cough has persisted. She states she has coughing fits at times, worse at night. No known fevers. Without sore throat, ear pain. Does have nasal drainage. Does not smoke, no recent travel. Chest pain with coughing, currently denies pain. She no longer has the inhaler. Without history of asthma. Has been taking mucinex dayquil and nyquil which provides temporary relief. Denies leg pain or swelling.    ROS per HPI.       Past Medical History:  Diagnosis Date  . Migraine     Patient Active Problem List   Diagnosis Date Noted  . Uses Depo-Provera as primary birth control method 04/18/2017  . Migraine without status migrainosus, not intractable 07/26/2016    Past Surgical History:  Procedure Laterality Date  . NO PAST SURGERIES      OB History    Gravida Para Term Preterm AB Living   2 1 1   1 1    SAB TAB Ectopic Multiple Live Births         0 1       Home Medications    Prior to Admission medications   Medication Sig Start Date End Date Taking? Authorizing Provider  medroxyPROGESTERone (DEPO-PROVERA) 150 MG/ML injection Inject 1 mL (150 mg total) into the muscle every 3 (three) months. 12/09/16  Yes Hildred Laserherry, Anika, MD  Prenatal Vit-Fe Fumarate-FA (PNV PRENATAL PLUS MULTIVITAMIN) 27-1 MG TABS Take 1 tablet by mouth daily. 03/24/16  Yes [provider]  albuterol (PROVENTIL HFA;VENTOLIN HFA) 108 (90 Base) MCG/ACT inhaler Inhale 1-2 puffs into the lungs every 6 (six) hours as needed for wheezing or shortness of breath. 06/19/17   Georgetta HaberBurky, Stylianos Stradling B, NP  predniSONE (DELTASONE) 20  MG tablet Take 2 tablets (40 mg total) by mouth daily with breakfast for 5 days. 06/19/17 06/24/17  Georgetta HaberBurky, Cammie Faulstich B, NP    Family History Family History  Problem Relation Age of Onset  . Rheum arthritis Mother   . Diabetes Father   . Thyroid disease Father     Social History Social History   Tobacco Use  . Smoking status: Never Smoker  . Smokeless tobacco: Never Used  Substance Use Topics  . Alcohol use: No  . Drug use: No     Allergies   Patient has no known allergies.   Review of Systems Review of Systems   Physical Exam Triage Vital Signs ED Triage Vitals  Enc Vitals Group     BP 06/19/17 1503 129/75     Pulse Rate 06/19/17 1503 96     Resp --      Temp 06/19/17 1503 98.8 F (37.1 C)     Temp Source 06/19/17 1503 Oral     SpO2 06/19/17 1503 99 %     Weight --      Height --      Head Circumference --      Peak Flow --      Pain Score 06/19/17 1501 6     Pain Loc --      Pain Edu? --  Excl. in GC? --    No data found.  Updated Vital Signs BP 129/75 (BP Location: Left Arm)   Pulse 96   Temp 98.8 F (37.1 C) (Oral)   LMP 06/15/2017 (Approximate)   SpO2 99%   Visual Acuity Right Eye Distance:   Left Eye Distance:   Bilateral Distance:    Right Eye Near:   Left Eye Near:    Bilateral Near:     Physical Exam  Constitutional: She is oriented to person, place, and time. She appears well-developed and well-nourished. No distress.  HENT:  Head: Normocephalic and atraumatic.  Right Ear: Tympanic membrane, external ear and ear canal normal.  Left Ear: Tympanic membrane, external ear and ear canal normal.  Nose: Rhinorrhea present.  Mouth/Throat: Uvula is midline, oropharynx is clear and moist and mucous membranes are normal. No tonsillar exudate.  Eyes: Conjunctivae and EOM are normal. Pupils are equal, round, and reactive to light.  Cardiovascular: Normal rate, regular rhythm and normal heart sounds.  Pulmonary/Chest: Effort normal and  breath sounds normal. She has no wheezes.  Strong dry cough noted  Neurological: She is alert and oriented to person, place, and time.  Skin: Skin is warm and dry.     UC Treatments / Results  Labs (all labs ordered are listed, but only abnormal results are displayed) Labs Reviewed - No data to display  EKG  EKG Interpretation None       Radiology No results found.  Procedures Procedures (including critical care time)  Medications Ordered in UC Medications - No data to display   Initial Impression / Assessment and Plan / UC Course  I have reviewed the triage vital signs and the nursing notes.  Pertinent labs & imaging results that were available during my care of the patient were reviewed by me and considered in my medical decision making (see chart for details).     Without tachycardia, tachypnea, hypoxia. Afebrile. Non toxic non distressed in appearance. Complete course of prednisone for bronchitis. Albuterol as needed. If symptoms worsen or do not improve in the next week to return to be seen or to follow up with PCP. Marland Kitchen. Patient verbalized understanding and agreeable to plan.    Final Clinical Impressions(s) / UC Diagnoses   Final diagnoses:  Acute bronchitis, unspecified organism    ED Discharge Orders        Ordered    predniSONE (DELTASONE) 20 MG tablet  Daily with breakfast     06/19/17 1513    albuterol (PROVENTIL HFA;VENTOLIN HFA) 108 (90 Base) MCG/ACT inhaler  Every 6 hours PRN     06/19/17 1513       Controlled Substance Prescriptions [place] Controlled Substance Registry consulted? Not Applicable   Georgetta HaberBurky, Hanni Milford B, NP 06/19/17 1522    Georgetta HaberBurky, Charelle Petrakis B, NP 06/19/17 [phone]

## 2017-06-19 NOTE — Discharge Instructions (Signed)
Push fluids to ensure adequate hydration and keep secretions thin.   Tylenol and/or ibuprofen as needed for pain or fevers.  Inhaler as needed for wheezing, chest tightness or shortness of breath. Mucinex as an expectorant.  If symptoms worsen or do not improve in the next week to return to be seen or to follow up with PCP.

## 2017-06-19 NOTE — ED Triage Notes (Signed)
Per pt pressure in chest, coughing, nasal congestion, per pt throat is not sore, per pt her mucus color is green, per pt this has been going on for 1 month. Pt denies all other sx

## 2017-07-01 ENCOUNTER — Ambulatory Visit (INDEPENDENT_AMBULATORY_CARE_PROVIDER_SITE_OTHER): Payer: BLUE CROSS/BLUE SHIELD | Admitting: Obstetrics and Gynecology

## 2017-07-01 VITALS — Wt 161.4 lb

## 2017-07-01 DIAGNOSIS — Z3042 Encounter for surveillance of injectable contraceptive: Secondary | ICD-10-CM | POA: Diagnosis not present

## 2017-07-01 MED ORDER — MEDROXYPROGESTERONE ACETATE 150 MG/ML IM SUSP
150.0000 mg | Freq: Once | INTRAMUSCULAR | Status: AC
  Administered 2017-07-01: 150 mg via INTRAMUSCULAR

## 2017-07-01 NOTE — Progress Notes (Signed)
Pt is here for depo provera inj, she is doing well denies any s/e

## 2017-09-23 ENCOUNTER — Ambulatory Visit: Payer: BLUE CROSS/BLUE SHIELD

## 2017-09-29 ENCOUNTER — Ambulatory Visit (INDEPENDENT_AMBULATORY_CARE_PROVIDER_SITE_OTHER): Payer: Managed Care, Other (non HMO) | Admitting: Obstetrics and Gynecology

## 2017-09-29 VITALS — BP 111/78 | HR 66 | Ht 63.0 in | Wt 154.5 lb

## 2017-09-29 DIAGNOSIS — Z789 Other specified health status: Secondary | ICD-10-CM | POA: Diagnosis not present

## 2017-09-29 MED ORDER — MEDROXYPROGESTERONE ACETATE 150 MG/ML IM SUSP
150.0000 mg | Freq: Once | INTRAMUSCULAR | Status: AC
  Administered 2017-09-29: 150 mg via INTRAMUSCULAR

## 2017-09-29 NOTE — Progress Notes (Signed)
Date last pap: na Last Depo-Provera: 07/01/17 Side Effects if ZOX:WRUEany:none Serum HCG indicated? na. Depo-Provera 150 mg IM given by: FH. Next appointment due June 13-June 27   BP 111/78   Pulse 66   Ht 5\' 3"  (1.6 m)   Wt 154 lb 8 oz (70.1 kg)   LMP  (LMP Unknown) Comment: depo injection  BMI 27.37 kg/m

## 2017-10-26 ENCOUNTER — Ambulatory Visit (INDEPENDENT_AMBULATORY_CARE_PROVIDER_SITE_OTHER): Payer: Managed Care, Other (non HMO) | Admitting: Obstetrics and Gynecology

## 2017-10-26 ENCOUNTER — Encounter: Payer: Self-pay | Admitting: Obstetrics and Gynecology

## 2017-10-26 VITALS — BP 104/71 | HR 90 | Ht 63.0 in | Wt 150.0 lb

## 2017-10-26 DIAGNOSIS — Z3042 Encounter for surveillance of injectable contraceptive: Secondary | ICD-10-CM

## 2017-10-26 DIAGNOSIS — Z01419 Encounter for gynecological examination (general) (routine) without abnormal findings: Secondary | ICD-10-CM | POA: Diagnosis not present

## 2017-10-26 DIAGNOSIS — E663 Overweight: Secondary | ICD-10-CM | POA: Diagnosis not present

## 2017-10-26 DIAGNOSIS — Z124 Encounter for screening for malignant neoplasm of cervix: Secondary | ICD-10-CM | POA: Diagnosis not present

## 2017-10-26 NOTE — Progress Notes (Signed)
Pt is doing well no concerns or problems.

## 2017-10-26 NOTE — Addendum Note (Signed)
Addended by: Fabian NovemberHERRY, Orvel Cutsforth S on: 10/26/2017 05:02 PM   Modules accepted: Orders

## 2017-10-26 NOTE — Progress Notes (Signed)
GYNECOLOGY ANNUAL PHYSICAL EXAM PROGRESS NOTE  Subjective:    Emily Dean is a 27 y.o. G55P1011 female who presents for an annual exam. The patient has no complaints today. The patient is sexually active.  The patient wears seatbelts: yes. The patient participates in regular exercise: yes (3 times weekly). Has the patient ever been transfused or tattooed?: yes (professional tattoos) . The patient reports that there is not domestic violence in her life.    Gynecologic History No LMP recorded (lmp unknown). Patient has had an injection. Contraception: Depo-Provera injections History of STI's: Denies Last Pap: 05/2015. Results were: normal.  Denies h/o abnormal pap smears.    OB History  Gravida Para Term Preterm AB Living  2 1 1  0 1 1  SAB TAB Ectopic Multiple Live Births  0 0 0 0 1    # Outcome Date GA Lbr Len/2nd Weight Sex Delivery Anes PTL Lv  2 Term 10/28/16 [redacted]w[redacted]d / 00:23 5 lb 12.1 oz (2.61 kg) M Vag-Spont None  LIV     Name: Weekly,BOY Harolyn     Apgar1: 8  Apgar5: 9  1 AB 2008            Past Medical History:  Diagnosis Date  . Migraine     Past Surgical History:  Procedure Laterality Date  . NO PAST SURGERIES      Family History  Problem Relation Age of Onset  . Rheum arthritis Mother   . Diabetes Father   . Thyroid disease Father     Social History   Socioeconomic History  . Marital status: Single    Spouse name: Not on file  . Number of children: Not on file  . Years of education: Not on file  . Highest education level: Not on file  Occupational History  . Not on file  Social Needs  . Financial resource strain: Not on file  . Food insecurity:    Worry: Not on file    Inability: Not on file  . Transportation needs:    Medical: Not on file    Non-medical: Not on file  Tobacco Use  . Smoking status: Never Smoker  . Smokeless tobacco: Never Used  Substance and Sexual Activity  . Alcohol use: No  . Drug use: No  . Sexual activity:  Yes    Birth control/protection: Injection, Condom  Lifestyle  . Physical activity:    Days per week: Not on file    Minutes per session: Not on file  . Stress: Not on file  Relationships  . Social connections:    Talks on phone: Not on file    Gets together: Not on file    Attends religious service: Not on file    Active member of club or organization: Not on file    Attends meetings of clubs or organizations: Not on file    Relationship status: Not on file  . Intimate partner violence:    Fear of current or ex partner: Not on file    Emotionally abused: Not on file    Physically abused: Not on file    Forced sexual activity: Not on file  Other Topics Concern  . Not on file  Social History Narrative  . Not on file    Current Outpatient Medications on File Prior to Visit  Medication Sig Dispense Refill  . albuterol (PROVENTIL HFA;VENTOLIN HFA) 108 (90 Base) MCG/ACT inhaler Inhale 1-2 puffs into the lungs every 6 (six) hours as  needed for wheezing or shortness of breath. 1 Inhaler 0  . medroxyPROGESTERone (DEPO-PROVERA) 150 MG/ML injection Inject 1 mL (150 mg total) into the muscle every 3 (three) months. 1 mL 3  . Prenatal Vit-Fe Fumarate-FA (PNV PRENATAL PLUS MULTIVITAMIN) 27-1 MG TABS Take 1 tablet by mouth daily.  3   No current facility-administered medications on file prior to visit.     No Known Allergies    Review of Systems Constitutional: negative for chills, fatigue, fevers and sweats Eyes: negative for irritation, redness and visual disturbance Ears, nose, mouth, throat, and face: negative for hearing loss, nasal congestion, snoring and tinnitus Respiratory: negative for asthma, cough, sputum Cardiovascular: negative for chest pain, dyspnea, exertional chest pressure/discomfort, irregular heart beat, palpitations and syncope Gastrointestinal: negative for abdominal pain, change in bowel habits, nausea and vomiting Genitourinary: negative for abnormal menstrual  periods, genital lesions, sexual problems and vaginal discharge, dysuria and urinary incontinence Integument/breast: negative for breast lump, breast tenderness and nipple discharge Hematologic/lymphatic: negative for bleeding and easy bruising Musculoskeletal:negative for back pain and muscle weakness Neurological: negative for dizziness, headaches, vertigo and weakness Endocrine: negative for diabetic symptoms including polydipsia, polyuria and skin dryness Allergic/Immunologic: negative for hay fever and urticaria        Objective:  Blood pressure 104/71, pulse 90, height 5\' 3"  (1.6 m), weight 150 lb (68 kg), not currently breastfeeding. Body mass index is 26.57 kg/m.  General Appearance:    Alert, cooperative, no distress, appears stated age, overweight  Head:    Normocephalic, without obvious abnormality, atraumatic  Eyes:    PERRL, conjunctiva/corneas clear, EOM's intact, both eyes  Ears:    Normal external ear canals, both ears  Nose:   Nares normal, septum midline, mucosa normal, no drainage or sinus tenderness  Throat:   Lips, mucosa, and tongue normal; teeth and gums normal  Neck:   Supple, symmetrical, trachea midline, no adenopathy; thyroid: no enlargement/tenderness/nodules; no carotid bruit or JVD  Back:     Symmetric, no curvature, ROM normal, no CVA tenderness  Lungs:     Clear to auscultation bilaterally, respirations unlabored  Chest Wall:    No tenderness or deformity   Heart:    Regular rate and rhythm, S1 and S2 normal, no murmur, rub or gallop  Breast Exam:    No tenderness, masses, or nipple abnormality  Abdomen:     Soft, non-tender, bowel sounds active all four quadrants, no masses, no organomegaly.    Genitalia:    Pelvic:external genitalia normal, vagina without lesions, discharge, or tenderness, rectovaginal septum  normal. Cervix normal in appearance, no cervical motion tenderness, no adnexal masses or tenderness.  Uterus normal size, shape, mobile, regular  contours, nontender.  Rectal:    Normal external sphincter.  No hemorrhoids appreciated. Internal exam not done.   Extremities:   Extremities normal, atraumatic, no cyanosis or edema  Pulses:   2+ and symmetric all extremities  Skin:   Skin color, texture, turgor normal, no rashes or lesions  Lymph nodes:   Cervical, supraclavicular, and axillary nodes normal  Neurologic:   CNII-XII intact, normal strength, sensation and reflexes throughout   .  Labs:  Lab Results  Component Value Date   WBC 18.7 (H) 10/28/2016   HGB 12.6 10/28/2016   HCT 37.4 10/28/2016   MCV 89.8 10/28/2016   PLT 166 10/28/2016    Lab Results  Component Value Date   CREATININE 0.54 07/23/2016   BUN <5 (L) 07/23/2016   NA 133 (L) 07/23/2016  K 3.7 07/23/2016   CL 105 07/23/2016   CO2 19 (L) 07/23/2016    Lab Results  Component Value Date   ALT 24 07/23/2016   AST 20 07/23/2016   ALKPHOS 67 07/23/2016   BILITOT 0.5 07/23/2016    No results found for: TSH   Assessment:    Healthy female exam.   Contraception management  Overweight  Plan:     Blood tests: CBC with diff and Comprehensive metabolic panel. Breast self exam technique reviewed and patient encouraged to perform self-exam monthly. Pap smear performed today.  Contraception: Depo-Provera injections.  Next injection due in 2 months (scheduled) Discussed healthy lifestyle modifications. Follow up in 1 year.     Hildred Laser, MD [facility]

## 2017-10-27 LAB — COMPREHENSIVE METABOLIC PANEL
ALT: 19 IU/L (ref 0–32)
AST: 19 IU/L (ref 0–40)
Albumin/Globulin Ratio: 1.6 (ref 1.2–2.2)
Albumin: 4.7 g/dL (ref 3.5–5.5)
Alkaline Phosphatase: 84 IU/L (ref 39–117)
BUN/Creatinine Ratio: 14 (ref 9–23)
BUN: 11 mg/dL (ref 6–20)
Bilirubin Total: 0.4 mg/dL (ref 0.0–1.2)
CO2: 24 mmol/L (ref 20–29)
Calcium: 9.9 mg/dL (ref 8.7–10.2)
Chloride: 104 mmol/L (ref 96–106)
Creatinine, Ser: 0.78 mg/dL (ref 0.57–1.00)
GFR calc Af Amer: 121 mL/min/{1.73_m2} (ref >59)
GFR calc non Af Amer: 105 mL/min/{1.73_m2} (ref >59)
Globulin, Total: 2.9 g/dL (ref 1.5–4.5)
Glucose: 65 mg/dL (ref 65–99)
Potassium: 4.3 mmol/L (ref 3.5–5.2)
Sodium: 142 mmol/L (ref 134–144)
Total Protein: 7.6 g/dL (ref 6.0–8.5)

## 2017-10-27 LAB — CBC
Hematocrit: 43.7 % (ref 34.0–46.6)
Hemoglobin: 14.3 g/dL (ref 11.1–15.9)
MCH: 29.2 pg (ref 26.6–33.0)
MCHC: 32.7 g/dL (ref 31.5–35.7)
MCV: 89 fL (ref 79–97)
Platelets: 277 10*3/uL (ref 150–379)
RBC: 4.9 x10E6/uL (ref 3.77–5.28)
RDW: 13.1 % (ref 12.3–15.4)
WBC: 9.4 10*3/uL (ref 3.4–10.8)

## 2017-10-28 LAB — PAP IG, CT-NG, RFX HPV ASCU
Chlamydia, Nuc. Acid Amp: NEGATIVE
Gonococcus by Nucleic Acid Amp: NEGATIVE
PAP Smear Comment: 0

## 2017-12-08 ENCOUNTER — Other Ambulatory Visit: Payer: Self-pay | Admitting: Obstetrics and Gynecology

## 2017-12-08 DIAGNOSIS — Z789 Other specified health status: Secondary | ICD-10-CM

## 2017-12-08 NOTE — Telephone Encounter (Signed)
Pt was called to inform that her depo injection had been refilled and [facility] to [facility] in [place]

## 2017-12-09 ENCOUNTER — Ambulatory Visit: Payer: Managed Care, Other (non HMO)

## 2017-12-12 ENCOUNTER — Ambulatory Visit: Payer: Managed Care, Other (non HMO)

## 2017-12-23 ENCOUNTER — Ambulatory Visit (INDEPENDENT_AMBULATORY_CARE_PROVIDER_SITE_OTHER): Payer: Managed Care, Other (non HMO) | Admitting: Obstetrics and Gynecology

## 2017-12-23 VITALS — BP 110/71 | HR 84 | Ht 63.0 in | Wt 145.3 lb

## 2017-12-23 DIAGNOSIS — Z789 Other specified health status: Secondary | ICD-10-CM

## 2017-12-23 DIAGNOSIS — Z3042 Encounter for surveillance of injectable contraceptive: Secondary | ICD-10-CM

## 2017-12-23 MED ORDER — MEDROXYPROGESTERONE ACETATE 150 MG/ML IM SUSP
150.0000 mg | Freq: Once | INTRAMUSCULAR | Status: AC
  Administered 2017-12-23: 150 mg via INTRAMUSCULAR

## 2017-12-23 NOTE — Progress Notes (Signed)
Date last pap: n/a  Last Depo-Provera: 09/29/2017 Side Effects if any: none Serum HCG indicated? n/a Depo-Provera 150 mg IM given by: CM Next appointment due  9/6----------9/20  BP 110/71   Pulse 84   Ht 5\' 3"  (1.6 m)   Wt 145 lb 4.8 oz (65.9 kg)   BMI 25.74 kg/m

## 2018-03-10 ENCOUNTER — Ambulatory Visit (INDEPENDENT_AMBULATORY_CARE_PROVIDER_SITE_OTHER): Payer: Managed Care, Other (non HMO) | Admitting: Obstetrics and Gynecology

## 2018-03-10 VITALS — BP 99/79 | HR 87 | Ht 63.0 in | Wt 146.2 lb

## 2018-03-10 DIAGNOSIS — Z3042 Encounter for surveillance of injectable contraceptive: Secondary | ICD-10-CM

## 2018-03-10 MED ORDER — MEDROXYPROGESTERONE ACETATE 150 MG/ML IM SUSP
150.0000 mg | Freq: Once | INTRAMUSCULAR | Status: AC
  Administered 2018-03-10: 150 mg via INTRAMUSCULAR

## 2018-03-10 NOTE — Progress Notes (Signed)
Last depo inj:12/23/17 UPT:N/A Side effects: none- spotting right before next inj due. Will get inj 1st allowable date Next Depo- Provera injection due: 05/26/18-06/09/18 Annual exam due:10/27/18

## 2018-03-10 NOTE — Progress Notes (Signed)
I have reviewed the record and concur with patient management and plan.  Brayden Betters, MD [facility]     

## 2018-03-30 ENCOUNTER — Ambulatory Visit (INDEPENDENT_AMBULATORY_CARE_PROVIDER_SITE_OTHER): Payer: 59 | Admitting: Psychology

## 2018-03-30 ENCOUNTER — Encounter

## 2018-03-30 DIAGNOSIS — F331 Major depressive disorder, recurrent, moderate: Secondary | ICD-10-CM

## 2018-04-10 ENCOUNTER — Ambulatory Visit (INDEPENDENT_AMBULATORY_CARE_PROVIDER_SITE_OTHER): Payer: 59 | Admitting: Psychology

## 2018-04-10 DIAGNOSIS — F331 Major depressive disorder, recurrent, moderate: Secondary | ICD-10-CM | POA: Diagnosis not present

## 2018-04-20 ENCOUNTER — Ambulatory Visit: Payer: 59 | Admitting: Psychology

## 2018-04-27 ENCOUNTER — Ambulatory Visit (INDEPENDENT_AMBULATORY_CARE_PROVIDER_SITE_OTHER): Payer: 59 | Admitting: Psychology

## 2018-04-27 DIAGNOSIS — F331 Major depressive disorder, recurrent, moderate: Secondary | ICD-10-CM

## 2018-05-03 ENCOUNTER — Ambulatory Visit: Payer: Self-pay | Admitting: Psychology

## 2018-05-25 NOTE — Progress Notes (Signed)
Date last pap:10/26/17 . Last Depo-Provera: 03/10/18 . Side Effects if OVF:IEPPany:none . Serum HCG indicated? n/a. Depo-Provera 150 mg IM given by: FH, LPN. Next appointment due Feb 7-Aug 25, 2018.  BP 116/85   Pulse 97   Ht 5\' 3"  (1.6 m)   Wt 150 lb 4.8 oz (68.2 kg)   BMI 26.62 kg/m

## 2018-05-26 ENCOUNTER — Ambulatory Visit (INDEPENDENT_AMBULATORY_CARE_PROVIDER_SITE_OTHER): Payer: Managed Care, Other (non HMO) | Admitting: Obstetrics and Gynecology

## 2018-05-26 VITALS — BP 116/85 | HR 97 | Ht 63.0 in | Wt 150.3 lb

## 2018-05-26 DIAGNOSIS — Z3042 Encounter for surveillance of injectable contraceptive: Secondary | ICD-10-CM | POA: Diagnosis not present

## 2018-05-26 MED ORDER — MEDROXYPROGESTERONE ACETATE 150 MG/ML IM SUSP
150.0000 mg | Freq: Once | INTRAMUSCULAR | Status: AC
  Administered 2018-05-26: 150 mg via INTRAMUSCULAR

## 2018-08-01 ENCOUNTER — Encounter: Payer: Self-pay | Admitting: Obstetrics and Gynecology

## 2018-08-01 ENCOUNTER — Ambulatory Visit (INDEPENDENT_AMBULATORY_CARE_PROVIDER_SITE_OTHER): Payer: Managed Care, Other (non HMO) | Admitting: Obstetrics and Gynecology

## 2018-08-01 VITALS — BP 126/82 | HR 90 | Ht 63.0 in | Wt 151.0 lb

## 2018-08-01 DIAGNOSIS — G43909 Migraine, unspecified, not intractable, without status migrainosus: Secondary | ICD-10-CM | POA: Diagnosis not present

## 2018-08-01 DIAGNOSIS — Z30011 Encounter for initial prescription of contraceptive pills: Secondary | ICD-10-CM | POA: Diagnosis not present

## 2018-08-01 MED ORDER — NORETHIN ACE-ETH ESTRAD-FE 1-20 MG-MCG(24) PO CAPS: 1.0000 | ORAL_CAPSULE | Freq: Every day | ORAL | 4 refills | Status: DC

## 2018-08-01 NOTE — Progress Notes (Signed)
Pt is present today to discuss other birth control options. Pt is thinking about using the pill for birth control.

## 2018-08-01 NOTE — Progress Notes (Signed)
   GYNECOLOGY CLINIC PROGRESS NOTE Subjective:    Emily Dean is a 28 y.o. 592P1011 female who presents for contraception management.  Patient is currently on Depo Provera, has been on it for 1.5 years. Last injection was ~ 3 months ago, due for next injection 08/11/18. Notes that since being on it, she has been feeling "yucky", with feelings of fatigue, achy, emotional. The patient is sexually active. Pertinent past medical history: migraines without aura, last had 3-4 years ago, but still has occasional headaches.  Is considering switching back to pills.   Menstrual History: OB History    Gravida  2   Para  1   Term  1   Preterm      AB  1   Living  1     SAB      TAB      Ectopic      Multiple  0   Live Births  1           Menarche age: [age] No LMP recorded. Patient has had an injection.    The following portions of the patient's history were reviewed and updated as appropriate: allergies, current medications, past family history, past medical history, past social history, past surgical history and problem list.  Review of Systems Pertinent items noted in HPI and remainder of comprehensive ROS otherwise negative.   Objective:    BP 126/82   Pulse 90   Ht 5\' 3"  (1.6 m)   Wt 151 lb (68.5 kg)   BMI 26.75 kg/m  General appearance: alert and no distress  Remainder of exam deferred.   Assessment:    28 y.o., discontinuing Depo-Provera injections, desiring to initiate combined OCPs,  no contraindications.   Plan:    Contraception: OCP (estrogen/progesterone).  Will prescribe Taytulla 20 mcg pills. Given sample, to begin Sunday start. Still within window of last Depo injection. To f/u as needed.     Hildred Laserherry, Salaya Holtrop, MD [facility]

## 2018-08-11 ENCOUNTER — Ambulatory Visit: Payer: Managed Care, Other (non HMO)

## 2019-04-11 ENCOUNTER — Encounter: Payer: Self-pay | Admitting: Obstetrics and Gynecology

## 2019-04-11 ENCOUNTER — Other Ambulatory Visit: Payer: Self-pay

## 2019-04-11 ENCOUNTER — Ambulatory Visit (INDEPENDENT_AMBULATORY_CARE_PROVIDER_SITE_OTHER): Payer: Managed Care, Other (non HMO) | Admitting: Obstetrics and Gynecology

## 2019-04-11 VITALS — BP 116/82 | HR 90 | Ht 63.0 in | Wt 148.5 lb

## 2019-04-11 DIAGNOSIS — B373 Candidiasis of vulva and vagina: Secondary | ICD-10-CM | POA: Diagnosis not present

## 2019-04-11 DIAGNOSIS — B3731 Acute candidiasis of vulva and vagina: Secondary | ICD-10-CM

## 2019-04-11 DIAGNOSIS — E663 Overweight: Secondary | ICD-10-CM | POA: Diagnosis not present

## 2019-04-11 DIAGNOSIS — Z01419 Encounter for gynecological examination (general) (routine) without abnormal findings: Secondary | ICD-10-CM | POA: Diagnosis not present

## 2019-04-11 DIAGNOSIS — Z3040 Encounter for surveillance of contraceptives, unspecified: Secondary | ICD-10-CM

## 2019-04-11 MED ORDER — FLUCONAZOLE 150 MG PO TABS: 150.0000 mg | ORAL_TABLET | Freq: Once | ORAL | 3 refills | Status: AC

## 2019-04-11 NOTE — Progress Notes (Signed)
GYNECOLOGY ANNUAL PHYSICAL EXAM PROGRESS NOTE  Subjective:    Emily Dean is a 28 y.o. G94P1011 female who presents for an annual exam.  The patient is sexually active.  The patient wears seatbelts: yes. The patient participates in regular exercise: yes (3 times weekly). Has the patient ever been transfused or tattooed?: yes (professional tattoos). The patient reports that there is not domestic violence in her life.    The patient has the following complaints today:  1. Vaginal discharge, white, x 5 days with associated itching.  Thinks it may be a yeast infection. Denies concerns for STI's at this time.   Gynecologic History Patient's last menstrual period was 03/20/2019 (approximate). Contraception: combined OCPs (Taytulla). Notes insurance is no longer going to cover after January. Will need new prescription.  History of STI's: Denies Last Pap: 10/26/2017. Results were: normal.  Denies h/o abnormal pap smears.    OB History  Gravida Para Term Preterm AB Living  2 1 1  0 1 1  SAB TAB Ectopic Multiple Live Births  0 1 0 0 1    # Outcome Date GA Lbr Len/2nd Weight Sex Delivery Anes PTL Lv  2 Term 10/28/16 [redacted]w[redacted]d / 00:23 5 lb 12.1 oz (2.61 kg) M Vag-Spont None  LIV     Name: Godbey,BOY Dejia     Apgar1: 8  Apgar5: 9  1 TAB 2008            Past Medical History:  Diagnosis Date  . Migraine     Past Surgical History:  Procedure Laterality Date  . NO PAST SURGERIES      Family History  Problem Relation Age of Onset  . Rheum arthritis Mother   . Diabetes Father   . Thyroid disease Father   . Breast cancer Neg Hx   . Ovarian cancer Neg Hx   . Colon cancer Neg Hx     Social History   Socioeconomic History  . Marital status: Single    Spouse name: Not on file  . Number of children: Not on file  . Years of education: Not on file  . Highest education level: Not on file  Occupational History  . Not on file  Social Needs  . Financial resource strain: Not on  file  . Food insecurity    Worry: Not on file    Inability: Not on file  . Transportation needs    Medical: Not on file    Non-medical: Not on file  Tobacco Use  . Smoking status: Never Smoker  . Smokeless tobacco: Never Used  Substance and Sexual Activity  . Alcohol use: No  . Drug use: No  . Sexual activity: Yes    Birth control/protection: Condom, Pill  Lifestyle  . Physical activity    Days per week: Not on file    Minutes per session: Not on file  . Stress: Not on file  Relationships  . Social Herbalist on phone: Not on file    Gets together: Not on file    Attends religious service: Not on file    Active member of club or organization: Not on file    Attends meetings of clubs or organizations: Not on file    Relationship status: Not on file  . Intimate partner violence    Fear of current or ex partner: Not on file    Emotionally abused: Not on file    Physically abused: Not on file  Forced sexual activity: Not on file  Other Topics Concern  . Not on file  Social History Narrative  . Not on file    Current Outpatient Medications on File Prior to Visit  Medication Sig Dispense Refill  . Norethin Ace-Eth Estrad-FE (TAYTULLA) 1-20 MG-MCG(24) CAPS Take 1 tablet by mouth daily. 84 capsule 4   No current facility-administered medications on file prior to visit.     No Known Allergies    Review of Systems Constitutional: negative for chills, fatigue, fevers and sweats Eyes: negative for irritation, redness and visual disturbance Ears, nose, mouth, throat, and face: negative for hearing loss, nasal congestion, snoring and tinnitus Respiratory: negative for asthma, cough, sputum Cardiovascular: negative for chest pain, dyspnea, exertional chest pressure/discomfort, irregular heart beat, palpitations and syncope Gastrointestinal: negative for abdominal pain, change in bowel habits, nausea and vomiting Genitourinary: negative for abnormal menstrual  periods, genital lesions, sexual problems and vaginal discharge, dysuria and urinary incontinence Integument/breast: negative for breast lump, breast tenderness and nipple discharge Hematologic/lymphatic: negative for bleeding and easy bruising Musculoskeletal:negative for back pain and muscle weakness Neurological: negative for dizziness, headaches, vertigo and weakness Endocrine: negative for diabetic symptoms including polydipsia, polyuria and skin dryness Allergic/Immunologic: negative for hay fever and urticaria        Objective:  Blood pressure 116/82, pulse 90, height 5\' 3"  (1.6 m), weight 148 lb 8 oz (67.4 kg), last menstrual period 03/20/2019. Body mass index is 26.31 kg/m.  General Appearance:    Alert, cooperative, no distress, appears stated age, overweight  Head:    Normocephalic, without obvious abnormality, atraumatic  Eyes:    PERRL, conjunctiva/corneas clear, EOM's intact, both eyes  Ears:    Normal external ear canals, both ears  Nose:   Nares normal, septum midline, mucosa normal, no drainage or sinus tenderness  Throat:   Lips, mucosa, and tongue normal; teeth and gums normal  Neck:   Supple, symmetrical, trachea midline, no adenopathy; thyroid: no enlargement/tenderness/nodules; no carotid bruit or JVD  Back:     Symmetric, no curvature, ROM normal, no CVA tenderness  Lungs:     Clear to auscultation bilaterally, respirations unlabored  Chest Wall:    No tenderness or deformity   Heart:    Regular rate and rhythm, S1 and S2 normal, no murmur, rub or gallop  Breast Exam:    No tenderness, masses, or nipple abnormality  Abdomen:     Soft, non-tender, bowel sounds active all four quadrants, no masses, no organomegaly.    Genitalia:    Pelvic:external genitalia normal, vagina without lesions, discharge, or tenderness, rectovaginal septum  normal. Cervix normal in appearance, no cervical motion tenderness, no adnexal masses or tenderness.  Uterus normal size, shape, mobile,  regular contours, nontender.  Rectal:    Normal external sphincter.  No hemorrhoids appreciated. Internal exam not done.   Extremities:   Extremities normal, atraumatic, no cyanosis or edema  Pulses:   2+ and symmetric all extremities  Skin:   Skin color, texture, turgor normal, no rashes or lesions  Lymph nodes:   Cervical, supraclavicular, and axillary nodes normal  Neurologic:   CNII-XII intact, normal strength, sensation and reflexes throughout   .  Labs:   Microscopic wet-mount exam shows no clue cells, few hyphae, no trichomonads, no white blood cells. KOH done.    Lab Results  Component Value Date   WBC 9.4 10/26/2017   HGB 14.3 10/26/2017   HCT 43.7 10/26/2017   MCV 89 10/26/2017   PLT  277 10/26/2017    Lab Results  Component Value Date   CREATININE 0.78 10/26/2017   BUN 11 10/26/2017   NA 142 10/26/2017   K 4.3 10/26/2017   CL 104 10/26/2017   CO2 24 10/26/2017    Lab Results  Component Value Date   ALT 19 10/26/2017   AST 19 10/26/2017   ALKPHOS 84 10/26/2017   BILITOT 0.4 10/26/2017    No results found for: TSH   Assessment:    Healthy female exam.  Yeast vaginitis Contraception surveillance Overweight  Plan:    Blood tests: CBC with diff and Comprehensive metabolic panel. Breast self exam technique reviewed and patient encouraged to perform self-exam monthly. Pap smear up to date.  Contraception: combined OCPs.  Yeast vaginitis, will prescribe Diflucan (notes that Monistat OTC did not work for her in the past).  Declines flu vaccine today. Will get later in the season.  Discussed healthy lifestyle modifications. Follow up in 1 year.     Hildred Laser, MD [facility]

## 2019-04-11 NOTE — Progress Notes (Signed)
Patient c/o thin white vaginal discharge and itching x5 days, no odor.

## 2019-04-11 NOTE — Patient Instructions (Signed)
Health Maintenance, Female Adopting a healthy lifestyle and getting preventive care are important in promoting health and wellness. Ask your health care provider about:  The right schedule for you to have regular tests and exams.  Things you can do on your own to prevent diseases and keep yourself healthy. What should I know about diet, weight, and exercise? Eat a healthy diet   Eat a diet that includes plenty of vegetables, fruits, low-fat dairy products, and lean protein.  Do not eat a lot of foods that are high in solid fats, added sugars, or sodium. Maintain a healthy weight Body mass index (BMI) is used to identify weight problems. It estimates body fat based on height and weight. Your health care provider can help determine your BMI and help you achieve or maintain a healthy weight. Get regular exercise Get regular exercise. This is one of the most important things you can do for your health. Most adults should:  Exercise for at least 150 minutes each week. The exercise should increase your heart rate and make you sweat (moderate-intensity exercise).  Do strengthening exercises at least twice a week. This is in addition to the moderate-intensity exercise.  Spend less time sitting. Even light physical activity can be beneficial. Watch cholesterol and blood lipids Have your blood tested for lipids and cholesterol at 28 years of age, then have this test every 5 years. Have your cholesterol levels checked more often if:  Your lipid or cholesterol levels are high.  You are older than 28 years of age.  You are at high risk for heart disease. What should I know about cancer screening? Depending on your health history and family history, you may need to have cancer screening at various ages. This may include screening for:  Breast cancer.  Cervical cancer.  Colorectal cancer.  Skin cancer.  Lung cancer. What should I know about heart disease, diabetes, and high blood  pressure? Blood pressure and heart disease  High blood pressure causes heart disease and increases the risk of stroke. This is more likely to develop in people who have high blood pressure readings, are of African descent, or are overweight.  Have your blood pressure checked: ? Every 3-5 years if you are 18-39 years of age. ? Every year if you are 40 years old or older. Diabetes Have regular diabetes screenings. This checks your fasting blood sugar level. Have the screening done:  Once every three years after age 40 if you are at a normal weight and have a low risk for diabetes.  More often and at a younger age if you are overweight or have a high risk for diabetes. What should I know about preventing infection? Hepatitis B If you have a higher risk for hepatitis B, you should be screened for this virus. Talk with your health care provider to find out if you are at risk for hepatitis B infection. Hepatitis C Testing is recommended for:  Everyone born from 1945 through 1965.  Anyone with known risk factors for hepatitis C. Sexually transmitted infections (STIs)  Get screened for STIs, including gonorrhea and chlamydia, if: ? You are sexually active and are younger than 28 years of age. ? You are older than 28 years of age and your health care provider tells you that you are at risk for this type of infection. ? Your sexual activity has changed since you were last screened, and you are at increased risk for chlamydia or gonorrhea. Ask your health care provider if   you are at risk.  Ask your health care provider about whether you are at high risk for HIV. Your health care provider may recommend a prescription medicine to help prevent HIV infection. If you choose to take medicine to prevent HIV, you should first get tested for HIV. You should then be tested every 3 months for as long as you are taking the medicine. Pregnancy  If you are about to stop having your period (premenopausal) and  you may become pregnant, seek counseling before you get pregnant.  Take 400 to 800 micrograms (mcg) of folic acid every day if you become pregnant.  Ask for birth control (contraception) if you want to prevent pregnancy. Osteoporosis and menopause Osteoporosis is a disease in which the bones lose minerals and strength with aging. This can result in bone fractures. If you are 42 years old or older, or if you are at risk for osteoporosis and fractures, ask your health care provider if you should:  Be screened for bone loss.  Take a calcium or vitamin D supplement to lower your risk of fractures.  Be given hormone replacement therapy (HRT) to treat symptoms of menopause. Follow these instructions at home: Lifestyle  Do not use any products that contain nicotine or tobacco, such as cigarettes, e-cigarettes, and chewing tobacco. If you need help quitting, ask your health care provider.  Do not use street drugs.  Do not share needles.  Ask your health care provider for help if you need support or information about quitting drugs. Alcohol use  Do not drink alcohol if: ? Your health care provider tells you not to drink. ? You are pregnant, may be pregnant, or are planning to become pregnant.  If you drink alcohol: ? Limit how much you use to 0-1 drink a day. ? Limit intake if you are breastfeeding.  Be aware of how much alcohol is in your drink. In the [place] one drink equals one 12 oz bottle of beer (355 mL), one 5 oz glass of wine (148 mL), or one 1 oz glass of hard liquor (44 mL). General instructions  Schedule regular health, dental, and eye exams.  Stay current with your vaccines.  Tell your health care provider if: ? You often feel depressed. ? You have ever been abused or do not feel safe at home. Summary  Adopting a healthy lifestyle and getting preventive care are important in promoting health and wellness.  Follow your health care provider's instructions about healthy  diet, exercising, and getting tested or screened for diseases.  Follow your health care provider's instructions on monitoring your cholesterol and blood pressure. This information is not intended to replace advice given to you by your health care provider. Make sure you discuss any questions you have with your health care provider. Document Released: 01/04/2011 Document Revised: 06/14/2018 Document Reviewed: 06/14/2018 Elsevier Patient Education  2020 [employer]     Vaginal Yeast infection, Adult  Vaginal yeast infection is a condition that causes vaginal discharge as well as soreness, swelling, and redness (inflammation) of the vagina. This is a common condition. Some women get this infection frequently. What are the causes? This condition is caused by a change in the normal balance of the yeast (candida) and bacteria that live in the vagina. This change causes an overgrowth of yeast, which causes the inflammation. What increases the risk? The condition is more likely to develop in women who:  Take antibiotic medicines.  Have diabetes.  Take birth control pills.  Are  pregnant.  Douche often.  Have a weak body defense system (immune system).  Have been taking steroid medicines for a long time.  Frequently wear tight clothing. What are the signs or symptoms? Symptoms of this condition include:  White, thick, creamy vaginal discharge.  Swelling, itching, redness, and irritation of the vagina. The lips of the vagina (vulva) may be affected as well.  Pain or a burning feeling while urinating.  Pain during sex. How is this diagnosed? This condition is diagnosed based on:  Your medical history.  A physical exam.  A pelvic exam. Your health care provider will examine a sample of your vaginal discharge under a microscope. Your health care provider may send this sample for testing to confirm the diagnosis. How is this treated? This condition is treated with medicine.  Medicines may be over-the-counter or prescription. You may be told to use one or more of the following:  Medicine that is taken by mouth (orally).  Medicine that is applied as a cream (topically).  Medicine that is inserted directly into the vagina (suppository). Follow these instructions at home:  Lifestyle  Do not have sex until your health care provider approves. Tell your sex partner that you have a yeast infection. That person should go to his or her health care provider and ask if they should also be treated.  Do not wear tight clothes, such as pantyhose or tight pants.  Wear breathable cotton underwear. General instructions  Take or apply over-the-counter and prescription medicines only as told by your health care provider.  Eat more yogurt. This may help to keep your yeast infection from returning.  Do not use tampons until your health care provider approves.  Try taking a sitz bath to help with discomfort. This is a warm water bath that is taken while you are sitting down. The water should only come up to your hips and should cover your buttocks. Do this 3-4 times per day or as told by your health care provider.  Do not douche.  If you have diabetes, keep your blood sugar levels under control.  Keep all follow-up visits as told by your health care provider. This is important. Contact a health care provider if:  You have a fever.  Your symptoms go away and then return.  Your symptoms do not get better with treatment.  Your symptoms get worse.  You have new symptoms.  You develop blisters in or around your vagina.  You have blood coming from your vagina and it is not your menstrual period.  You develop pain in your abdomen. Summary  Vaginal yeast infection is a condition that causes discharge as well as soreness, swelling, and redness (inflammation) of the vagina.  This condition is treated with medicine. Medicines may be over-the-counter or prescription.   Take or apply over-the-counter and prescription medicines only as told by your health care provider.  Do not douche. Do not have sex or use tampons until your health care provider approves.  Contact a health care provider if your symptoms do not get better with treatment or your symptoms go away and then return. This information is not intended to replace advice given to you by your health care provider. Make sure you discuss any questions you have with your health care provider. Document Released: 03/31/2005 Document Revised: 11/07/2017 Document Reviewed: 11/07/2017 Elsevier Patient Education  [place].

## 2019-04-12 LAB — COMPREHENSIVE METABOLIC PANEL
ALT: 16 IU/L (ref 0–32)
AST: 20 IU/L (ref 0–40)
Albumin/Globulin Ratio: 1.7 (ref 1.2–2.2)
Albumin: 4.5 g/dL (ref 3.9–5.0)
Alkaline Phosphatase: 71 IU/L (ref 39–117)
BUN/Creatinine Ratio: 12 (ref 9–23)
BUN: 9 mg/dL (ref 6–20)
Bilirubin Total: <0.2 mg/dL (ref 0.0–1.2)
CO2: 23 mmol/L (ref 20–29)
Calcium: 9.6 mg/dL (ref 8.7–10.2)
Chloride: 102 mmol/L (ref 96–106)
Creatinine, Ser: 0.78 mg/dL (ref 0.57–1.00)
GFR calc Af Amer: 120 mL/min/{1.73_m2} (ref >59)
GFR calc non Af Amer: 105 mL/min/{1.73_m2} (ref >59)
Globulin, Total: 2.7 g/dL (ref 1.5–4.5)
Glucose: 67 mg/dL (ref 65–99)
Potassium: 4.1 mmol/L (ref 3.5–5.2)
Sodium: 139 mmol/L (ref 134–144)
Total Protein: 7.2 g/dL (ref 6.0–8.5)

## 2019-04-12 LAB — CBC
Hematocrit: 40.6 % (ref 34.0–46.6)
Hemoglobin: 13.6 g/dL (ref 11.1–15.9)
MCH: 30.2 pg (ref 26.6–33.0)
MCHC: 33.5 g/dL (ref 31.5–35.7)
MCV: 90 fL (ref 79–97)
Platelets: 274 10*3/uL (ref 150–450)
RBC: 4.5 x10E6/uL (ref 3.77–5.28)
RDW: 12 % (ref 11.7–15.4)
WBC: 7.9 10*3/uL (ref 3.4–10.8)

## 2019-07-20 ENCOUNTER — Telehealth: Payer: Self-pay

## 2019-07-20 MED ORDER — NORETHIN ACE-ETH ESTRAD-FE 1-20 MG-MCG(24) PO TABS: 1.0000 | ORAL_TABLET | Freq: Every day | ORAL | 4 refills | Status: DC

## 2019-07-20 NOTE — Telephone Encounter (Signed)
Pt called no answer LM informing via VM that I sent her message to Dr. Valentino Saxon and how many refills she had remaining.

## 2019-07-20 NOTE — Telephone Encounter (Signed)
Patien;t insurance will no linger cover current birth control. Patient would like to know how to move fwd?

## 2019-07-20 NOTE — Addendum Note (Signed)
Addended by: Silvano Bilis on: 07/20/2019 04:40 PM   Modules accepted: Orders

## 2019-07-20 NOTE — Telephone Encounter (Signed)
Pt is aware that her medication has been changed and Loestrin had been sent in to her pharmacy.

## 2019-08-05 ENCOUNTER — Other Ambulatory Visit: Payer: Self-pay | Admitting: Obstetrics and Gynecology

## 2019-10-04 ENCOUNTER — Ambulatory Visit
Admission: EM | Admit: 2019-10-04 | Discharge: 2019-10-04 | Disposition: A | Discharge status: Home / Self Care | Payer: Managed Care, Other (non HMO) | Attending: Emergency Medicine | Admitting: Emergency Medicine

## 2019-10-04 DIAGNOSIS — N3001 Acute cystitis with hematuria: Secondary | ICD-10-CM | POA: Insufficient documentation

## 2019-10-04 DIAGNOSIS — B9689 Other specified bacterial agents as the cause of diseases classified elsewhere: Secondary | ICD-10-CM

## 2019-10-04 DIAGNOSIS — N760 Acute vaginitis: Secondary | ICD-10-CM | POA: Diagnosis present

## 2019-10-04 LAB — POCT URINALYSIS DIP (MANUAL ENTRY)
Bilirubin, UA: NEGATIVE
Glucose, UA: NEGATIVE mg/dL
Ketones, POC UA: NEGATIVE mg/dL
Nitrite, UA: NEGATIVE
Protein Ur, POC: NEGATIVE mg/dL
Spec Grav, UA: 1.025 (ref 1.010–1.025)
Urobilinogen, UA: 1 E.U./dL
pH, UA: 7.5 (ref 5.0–8.0)

## 2019-10-04 MED ORDER — CEPHALEXIN 500 MG PO CAPS: 500.0000 mg | ORAL_CAPSULE | Freq: Two times a day (BID) | ORAL | 0 refills | Status: AC

## 2019-10-04 MED ORDER — FLUCONAZOLE 200 MG PO TABS: 200.0000 mg | ORAL_TABLET | Freq: Once | ORAL | 0 refills | Status: AC

## 2019-10-04 NOTE — Discharge Instructions (Signed)
Take yeast medication as directed: 1 today, another in 3 days if still having symptoms. May do sitz bath: Few inches of hot, plain, warm water for additional pain relief. Avoid detergents, soaps with scents/fragrances.  Wear cotton underwear and "air out" at night.  Testing for chlamydia, gonorrhea, trichomonas, BV is pending: please look for these results on the MyChart app/website.  We will notify you if you are positive and outline treatment at that time.  Important to avoid all forms of sexual intercourse (oral, vaginal, anal) with any/all partners for the next 7 days to avoid spreading/reinfecting. Any/all sexual partners should be notified of testing/treatment today.  Return for persistent/worsening symptoms or if you develop fever, abdominal or pelvic pain, blood in your urine, or are re-exposed to an STI.

## 2019-10-04 NOTE — ED Triage Notes (Signed)
Pt c/o burning on urination this am. States last night had burning to her vaginal area where she had shaved and got soap in it.

## 2019-10-04 NOTE — ED Provider Notes (Signed)
[facility]    CSN: [id number] Arrival date & time: 10/04/19  1514      History   Chief Complaint Chief Complaint  Patient presents with  . Urinary Tract Infection    HPI Emily Dean is a 29 y.o. female with history of migraines presenting for burning with urination and urinary urgency since this morning.  Patient does note that last night she had vulvar burning from where she had shaved.  Patient feels it was irritated by the soap she was using.  Patient denying vaginal or pelvic pain, discharge.  LMP 09/26/2019: Denies coitus since menstrual onset.   Past Medical History:  Diagnosis Date  . Migraine     Patient Active Problem List   Diagnosis Date Noted  . Overweight (BMI 25.0-29.9) 10/26/2017  . Migraine without status migrainosus, not intractable 07/26/2016    Past Surgical History:  Procedure Laterality Date  . NO PAST SURGERIES      OB History    Gravida  2   Para  1   Term  1   Preterm      AB  1   Living  1     SAB      TAB  1   Ectopic      Multiple  0   Live Births  1            Home Medications    Prior to Admission medications   Medication Sig Start Date End Date Taking? Authorizing Provider  norethindrone-ethinyl estradiol (CYCLAFEM) 0.5/0.75/1-35 MG-MCG tablet Take 1 tablet by mouth daily.   Yes [provider]  cephALEXin (KEFLEX) 500 MG capsule Take 1 capsule (500 mg total) by mouth 2 (two) times daily for 3 days. 10/04/19 10/07/19  Hall-Potvin, [place], PA-C  fluconazole (DIFLUCAN) 200 MG tablet Take 1 tablet (200 mg total) by mouth once for 1 dose. May repeat in 72 hours if needed 10/04/19 10/04/19  Hall-Potvin, [place], PA-C    Family History Family History  Problem Relation Age of Onset  . Rheum arthritis Mother   . Diabetes Father   . Thyroid disease Father   . Breast cancer Neg Hx   . Ovarian cancer Neg Hx   . Colon cancer Neg Hx     Social History Social History   Tobacco Use  .  Smoking status: Never Smoker  . Smokeless tobacco: Never Used  Substance Use Topics  . Alcohol use: No  . Drug use: No     Allergies   Patient has no known allergies.   Review of Systems As per HPI   Physical Exam Triage Vital Signs ED Triage Vitals  Enc Vitals Group     BP 10/04/19 1529 133/90     Pulse Rate 10/04/19 1529 75     Resp 10/04/19 1529 16     Temp 10/04/19 1529 98.2 F (36.8 C)     Temp Source 10/04/19 1529 Oral     SpO2 10/04/19 1529 98 %     Weight --      Height --      Head Circumference --      Peak Flow --      Pain Score 10/04/19 1538 0     Pain Loc --      Pain Edu? --      Excl. in GC? --    No data found.  Updated Vital Signs BP 133/90 (BP Location: Right Arm)   Pulse 75  Temp 98.2 F (36.8 C) (Oral)   Resp 16   LMP 09/26/2019   SpO2 98%   Visual Acuity Right Eye Distance:   Left Eye Distance:   Bilateral Distance:    Right Eye Near:   Left Eye Near:    Bilateral Near:     Physical Exam Constitutional:      General: She is not in acute distress. HENT:     Head: Normocephalic and atraumatic.  Eyes:     General: No scleral icterus.    Pupils: Pupils are equal, round, and reactive to light.  Cardiovascular:     Rate and Rhythm: Normal rate.  Pulmonary:     Effort: Pulmonary effort is normal.  Abdominal:     General: Bowel sounds are normal.     Palpations: Abdomen is soft.     Tenderness: There is no abdominal tenderness. There is no right CVA tenderness, left CVA tenderness or guarding.  Genitourinary:    General: Normal vulva.     Exam position: Supine.     Pubic Area: No rash.      Tanner stage (genital): 5.     Labia:        Right: No rash or tenderness.        Left: No rash or tenderness.      Urethra: No prolapse or urethral pain.     Vagina: Vaginal discharge and erythema present. No lesions.     Cervix: Normal.     Uterus: Normal.      Adnexa: Right adnexa normal and left adnexa normal.  Skin:     Coloration: Skin is not jaundiced or pale.  Neurological:     Mental Status: She is alert and oriented to person, place, and time.      UC Treatments / Results  Labs (all labs ordered are listed, but only abnormal results are displayed) Labs Reviewed  POCT URINALYSIS DIP (MANUAL ENTRY) - Abnormal; Notable for the following components:      Result Value   Blood, UA trace-intact (*)    Leukocytes, UA Trace (*)    All other components within normal limits  URINE CULTURE  CERVICOVAGINAL ANCILLARY ONLY    EKG   Radiology No results found.  Procedures Procedures (including critical care time)  Medications Ordered in UC Medications - No data to display  Initial Impression / Assessment and Plan / UC Course  I have reviewed the triage vital signs and the nursing notes.  Pertinent labs & imaging results that were available during my care of the patient were reviewed by me and considered in my medical decision making (see chart for details).     Patient vallecula nontoxic and without pelvic tenderness.  Exam with thick white discharge and vaginal canal irritation.  Will treat empirically for yeast vaginitis. Cervicovaginal swab pendin.  Urine dipstick showing trace leukocytes, trace intact blood: Culture pending.  Will start Keflex today for suspected UTI given urinary urgency with dysuria.  Return precautions discussed, patient verbalized understanding and is agreeable to plan. Final Clinical Impressions(s) / UC Diagnoses   Final diagnoses:  Acute vaginitis  Acute cystitis with hematuria     Discharge Instructions     Take yeast medication as directed: 1 today, another in 3 days if still having symptoms. May do sitz bath: Few inches of hot, plain, warm water for additional pain relief. Avoid detergents, soaps with scents/fragrances.  Wear cotton underwear and "air out" at night.  Testing for chlamydia, gonorrhea, trichomonas,  BV is pending: please look for these results on  the MyChart app/website.  We will notify you if you are positive and outline treatment at that time.  Important to avoid all forms of sexual intercourse (oral, vaginal, anal) with any/all partners for the next 7 days to avoid spreading/reinfecting. Any/all sexual partners should be notified of testing/treatment today.  Return for persistent/worsening symptoms or if you develop fever, abdominal or pelvic pain, blood in your urine, or are re-exposed to an STI.    ED Prescriptions    Medication Sig Dispense Auth. Provider   fluconazole (DIFLUCAN) 200 MG tablet Take 1 tablet (200 mg total) by mouth once for 1 dose. May repeat in 72 hours if needed 2 tablet Hall-Potvin, [place], PA-C   cephALEXin (KEFLEX) 500 MG capsule Take 1 capsule (500 mg total) by mouth 2 (two) times daily for 3 days. 6 capsule Hall-Potvin, [place], PA-C     PDMP not reviewed this encounter.   Hall-Potvin, [place], [place] 10/04/19 1633

## 2019-10-08 ENCOUNTER — Telehealth: Payer: Self-pay | Admitting: Obstetrics and Gynecology

## 2019-10-08 NOTE — Telephone Encounter (Signed)
Pt called state she visited the Er 4/1 was diagnosed with UTI has finished her course of antibiotic. However is still having urinary symptoms. She is unsure if he still needs to come in for her app tomorrow. Urine was cultured can you please advise patient . Thanks

## 2019-10-08 NOTE — Telephone Encounter (Signed)
Spoke with patient and her symptoms are getting better. Patient was given a 3 day dose of Keflex and she has finished that. Urine cultures has not come back yet. I told patient we would cancel appointment for tomorrow and I would discuss with Dr. Valentino Saxon tomorrow about treatment.

## 2019-10-09 ENCOUNTER — Encounter: Payer: Managed Care, Other (non HMO) | Admitting: Obstetrics and Gynecology

## 2019-10-09 ENCOUNTER — Other Ambulatory Visit: Payer: Self-pay | Admitting: Surgical

## 2019-10-09 LAB — CERVICOVAGINAL ANCILLARY ONLY
Bacterial Vaginitis (gardnerella): NEGATIVE
Chlamydia: NEGATIVE
Comment: NEGATIVE
Comment: NEGATIVE
Comment: NEGATIVE
Comment: NORMAL
Neisseria Gonorrhea: NEGATIVE
Trichomonas: NEGATIVE

## 2019-10-09 MED ORDER — CEPHALEXIN 500 MG PO CAPS: 500.0000 mg | ORAL_CAPSULE | Freq: Two times a day (BID) | ORAL | 0 refills | Status: DC

## 2019-10-09 NOTE — Telephone Encounter (Signed)
Per Dr. Valentino Saxon sent in 4 more days of the Keflex. Urine culture not back yet.

## 2019-10-10 LAB — URINE CULTURE: Culture: >=100000 — AB

## 2020-03-25 ENCOUNTER — Telehealth: Payer: Self-pay | Admitting: Obstetrics and Gynecology

## 2020-03-25 MED ORDER — NORETHIN-ETH ESTRAD TRIPHASIC 0.5/0.75/1-35 MG-MCG PO TABS: 1.0000 | ORAL_TABLET | Freq: Every day | ORAL | 0 refills | Status: DC

## 2020-03-25 NOTE — Telephone Encounter (Signed)
Patient sent a MyChart message responding to her notification that her annual got pushed back- patient states she will run out of refills before we can get her in. Would it be possible to give her enough refills to make it until her AE?  -TC

## 2020-03-28 NOTE — Telephone Encounter (Signed)
Pt called no answer LM via VM that her medication Cyclafem had been refilled and sent to the [facility] in [place] on [address] Pt was informed via VM that if she had any problems or concerns to please contact the office.

## 2020-04-11 ENCOUNTER — Encounter: Payer: Managed Care, Other (non HMO) | Admitting: Obstetrics and Gynecology

## 2020-04-13 ENCOUNTER — Other Ambulatory Visit: Payer: Self-pay | Admitting: Obstetrics and Gynecology

## 2020-05-07 NOTE — Progress Notes (Signed)
Pt present for annual exam. Pt stated that she was doing well no problems.  

## 2020-05-07 NOTE — Patient Instructions (Addendum)
Preventive Care 21-29 Years Old, Female Preventive care refers to visits with your health care provider and lifestyle choices that can promote health and wellness. This includes:  A yearly physical exam. This may also be called an annual well check.  Regular dental visits and eye exams.  Immunizations.  Screening for certain conditions.  Healthy lifestyle choices, such as eating a healthy diet, getting regular exercise, not using drugs or products that contain nicotine and tobacco, and limiting alcohol use. What can I expect for my preventive care visit? Physical exam Your health care provider will check your:  Height and weight. This may be used to calculate body mass index (BMI), which tells if you are at a healthy weight.  Heart rate and blood pressure.  Skin for abnormal spots. Counseling Your health care provider may ask you questions about your:  Alcohol, tobacco, and drug use.  Emotional well-being.  Home and relationship well-being.  Sexual activity.  Eating habits.  Work and work environment.  Method of birth control.  Menstrual cycle.  Pregnancy history. What immunizations do I need?  Influenza (flu) vaccine  This is recommended every year. Tetanus, diphtheria, and pertussis (Tdap) vaccine  You may need a Td booster every 10 years. Varicella (chickenpox) vaccine  You may need this if you have not been vaccinated. Human papillomavirus (HPV) vaccine  If recommended by your health care provider, you may need three doses over 6 months. Measles, mumps, and rubella (MMR) vaccine  You may need at least one dose of MMR. You may also need a second dose. Meningococcal conjugate (MenACWY) vaccine  One dose is recommended if you are age 19-21 years and a first-year college student living in a residence hall, or if you have one of several medical conditions. You may also need additional booster doses. Pneumococcal conjugate (PCV13) vaccine  You may need  this if you have certain conditions and were not previously vaccinated. Pneumococcal polysaccharide (PPSV23) vaccine  You may need one or two doses if you smoke cigarettes or if you have certain conditions. Hepatitis A vaccine  You may need this if you have certain conditions or if you travel or work in places where you may be exposed to hepatitis A. Hepatitis B vaccine  You may need this if you have certain conditions or if you travel or work in places where you may be exposed to hepatitis B. Haemophilus influenzae type b (Hib) vaccine  You may need this if you have certain conditions. You may receive vaccines as individual doses or as more than one vaccine together in one shot (combination vaccines). Talk with your health care provider about the risks and benefits of combination vaccines. What tests do I need?  Blood tests  Lipid and cholesterol levels. These may be checked every 5 years starting at age 20.  Hepatitis C test.  Hepatitis B test. Screening  Diabetes screening. This is done by checking your blood sugar (glucose) after you have not eaten for a while (fasting).  Sexually transmitted disease (STD) testing.  BRCA-related cancer screening. This may be done if you have a family history of breast, ovarian, tubal, or peritoneal cancers.  Pelvic exam and Pap test. This may be done every 3 years starting at age 21. Starting at age 30, this may be done every 5 years if you have a Pap test in combination with an HPV test. Talk with your health care provider about your test results, treatment options, and if necessary, the need for more tests.   Follow these instructions at home: Eating and drinking   Eat a diet that includes fresh fruits and vegetables, whole grains, lean protein, and low-fat dairy.  Take vitamin and mineral supplements as recommended by your health care provider.  Do not drink alcohol if: ? Your health care provider tells you not to drink. ? You are  pregnant, may be pregnant, or are planning to become pregnant.  If you drink alcohol: ? Limit how much you have to 0-1 drink a day. ? Be aware of how much alcohol is in your drink. In the [place] one drink equals one 12 oz bottle of beer (355 mL), one 5 oz glass of wine (148 mL), or one 1 oz glass of hard liquor (44 mL). Lifestyle  Take daily care of your teeth and gums.  Stay active. Exercise for at least 30 minutes on 5 or more days each week.  Do not use any products that contain nicotine or tobacco, such as cigarettes, e-cigarettes, and chewing tobacco. If you need help quitting, ask your health care provider.  If you are sexually active, practice safe sex. Use a condom or other form of birth control (contraception) in order to prevent pregnancy and STIs (sexually transmitted infections). If you plan to become pregnant, see your health care provider for a preconception visit. What's next?  Visit your health care provider once a year for a well check visit.  Ask your health care provider how often you should have your eyes and teeth checked.  Stay up to date on all vaccines. This information is not intended to replace advice given to you by your health care provider. Make sure you discuss any questions you have with your health care provider. Document Revised: 03/02/2018 Document Reviewed: 03/02/2018 Elsevier Patient Education  2020 [employer] Breast Self-Awareness Breast self-awareness is knowing how your breasts look and feel. Doing breast self-awareness is important. It allows you to catch a breast problem early while it is still small and can be treated. All women should do breast self-awareness, including women who have had breast implants. Tell your doctor if you notice a change in your breasts. What you need:  A mirror.  A well-lit room. How to do a breast self-exam A breast self-exam is one way to learn what is normal for your breasts and to check for changes. To do a  breast self-exam: Look for changes  1. Take off all the clothes above your waist. 2. Stand in front of a mirror in a room with good lighting. 3. Put your hands on your hips. 4. Push your hands down. 5. Look at your breasts and nipples in the mirror to see if one breast or nipple looks different from the other. Check to see if: ? The shape of one breast is different. ? The size of one breast is different. ? There are wrinkles, dips, and bumps in one breast and not the other. 6. Look at each breast for changes in the skin, such as: ? Redness. ? Scaly areas. 7. Look for changes in your nipples, such as: ? Liquid around the nipples. ? Bleeding. ? Dimpling. ? Redness. ? A change in where the nipples are. Feel for changes  1. Lie on your back on the floor. 2. Feel each breast. To do this, follow these steps: ? Pick a breast to feel. ? Put the arm closest to that breast above your head. ? Use your other arm to feel the nipple area of your breast. Feel   the area with the pads of your three middle fingers by making small circles with your fingers. For the first circle, press lightly. For the second circle, press harder. For the third circle, press even harder. ? Keep making circles with your fingers at the different pressures as you move down your breast. Stop when you feel your ribs. ? Move your fingers a little toward the center of your body. ? Start making circles with your fingers again, this time going up until you reach your collarbone. ? Keep making up-and-down circles until you reach your armpit. Remember to keep using the three pressures. ? Feel the other breast in the same way. 3. Sit or stand in the tub or shower. 4. With soapy water on your skin, feel each breast the same way you did in step 2 when you were lying on the floor. Write down what you find Writing down what you find can help you remember what to tell your doctor. Write down:  What is normal for each breast.  Any  changes you find in each breast, including: ? The kind of changes you find. ? Whether you have pain. ? Size and location of any lumps.  When you last had your menstrual period. General tips  Check your breasts every month.  If you are breastfeeding, the best time to check your breasts is after you feed your baby or after you use a breast pump.  If you get menstrual periods, the best time to check your breasts is 5-7 days after your menstrual period is over.  With time, you will become comfortable with the self-exam, and you will begin to know if there are changes in your breasts. Contact a doctor if you:  See a change in the shape or size of your breasts or nipples.  See a change in the skin of your breast or nipples, such as red or scaly skin.  Have fluid coming from your nipples that is not normal.  Find a lump or thick area that was not there before.  Have pain in your breasts.  Have any concerns about your breast health. Summary  Breast self-awareness includes looking for changes in your breasts, as well as feeling for changes within your breasts.  Breast self-awareness should be done in front of a mirror in a well-lit room.  You should check your breasts every month. If you get menstrual periods, the best time to check your breasts is 5-7 days after your menstrual period is over.  Let your doctor know of any changes you see in your breasts, including changes in size, changes on the skin, pain or tenderness, or fluid from your nipples that is not normal. This information is not intended to replace advice given to you by your health care provider. Make sure you discuss any questions you have with your health care provider. Document Revised: 02/07/2018 Document Reviewed: 02/07/2018 Elsevier Patient Education  [place] 86-58 Years Old, Female Preventive care refers to visits with your health care provider and lifestyle choices that can promote  health and wellness. This includes:  A yearly physical exam. This may also be called an annual well check.  Regular dental visits and eye exams.  Immunizations.  Screening for certain conditions.  Healthy lifestyle choices, such as eating a healthy diet, getting regular exercise, not using drugs or products that contain nicotine and tobacco, and limiting alcohol use. What can I expect for my preventive care visit? Physical exam Your  health care provider will check your:  Height and weight. This may be used to calculate body mass index (BMI), which tells if you are at a healthy weight.  Heart rate and blood pressure.  Skin for abnormal spots. Counseling Your health care provider may ask you questions about your:  Alcohol, tobacco, and drug use.  Emotional well-being.  Home and relationship well-being.  Sexual activity.  Eating habits.  Work and work Statistician.  Method of birth control.  Menstrual cycle.  Pregnancy history. What immunizations do I need?  Influenza (flu) vaccine  This is recommended every year. Tetanus, diphtheria, and pertussis (Tdap) vaccine  You may need a Td booster every 10 years. Varicella (chickenpox) vaccine  You may need this if you have not been vaccinated. Human papillomavirus (HPV) vaccine  If recommended by your health care provider, you may need three doses over 6 months. Measles, mumps, and rubella (MMR) vaccine  You may need at least one dose of MMR. You may also need a second dose. Meningococcal conjugate (MenACWY) vaccine  One dose is recommended if you are age 80-21 years and a first-year college student living in a residence hall, or if you have one of several medical conditions. You may also need additional booster doses. Pneumococcal conjugate (PCV13) vaccine  You may need this if you have certain conditions and were not previously vaccinated. Pneumococcal polysaccharide (PPSV23) vaccine  You may need one or two  doses if you smoke cigarettes or if you have certain conditions. Hepatitis A vaccine  You may need this if you have certain conditions or if you travel or work in places where you may be exposed to hepatitis A. Hepatitis B vaccine  You may need this if you have certain conditions or if you travel or work in places where you may be exposed to hepatitis B. Haemophilus influenzae type b (Hib) vaccine  You may need this if you have certain conditions. You may receive vaccines as individual doses or as more than one vaccine together in one shot (combination vaccines). Talk with your health care provider about the risks and benefits of combination vaccines. What tests do I need?  Blood tests  Lipid and cholesterol levels. These may be checked every 5 years starting at age 53.  Hepatitis C test.  Hepatitis B test. Screening  Diabetes screening. This is done by checking your blood sugar (glucose) after you have not eaten for a while (fasting).  Sexually transmitted disease (STD) testing.  BRCA-related cancer screening. This may be done if you have a family history of breast, ovarian, tubal, or peritoneal cancers.  Pelvic exam and Pap test. This may be done every 3 years starting at age 57. Starting at age 52, this may be done every 5 years if you have a Pap test in combination with an HPV test. Talk with your health care provider about your test results, treatment options, and if necessary, the need for more tests. Follow these instructions at home: Eating and drinking   Eat a diet that includes fresh fruits and vegetables, whole grains, lean protein, and low-fat dairy.  Take vitamin and mineral supplements as recommended by your health care provider.  Do not drink alcohol if: ? Your health care provider tells you not to drink. ? You are pregnant, may be pregnant, or are planning to become pregnant.  If you drink alcohol: ? Limit how much you have to 0-1 drink a day. ? Be aware of  how much alcohol is in  your drink. In the [place] one drink equals one 12 oz bottle of beer (355 mL), one 5 oz glass of wine (148 mL), or one 1 oz glass of hard liquor (44 mL). Lifestyle  Take daily care of your teeth and gums.  Stay active. Exercise for at least 30 minutes on 5 or more days each week.  Do not use any products that contain nicotine or tobacco, such as cigarettes, e-cigarettes, and chewing tobacco. If you need help quitting, ask your health care provider.  If you are sexually active, practice safe sex. Use a condom or other form of birth control (contraception) in order to prevent pregnancy and STIs (sexually transmitted infections). If you plan to become pregnant, see your health care provider for a preconception visit. What's next?  Visit your health care provider once a year for a well check visit.  Ask your health care provider how often you should have your eyes and teeth checked.  Stay up to date on all vaccines. This information is not intended to replace advice given to you by your health care provider. Make sure you discuss any questions you have with your health care provider. Document Revised: 03/02/2018 Document Reviewed: 03/02/2018 Elsevier Patient Education  2020 Else[employer] Inc.     Breast Self-Awareness Breast self-awareness means being familiar with how your breasts look and feel. It involves checking your breasts regularly and reporting any changes to your health care provider. Practicing breast self-awareness is important. Sometimes changes may not be harmful (are benign), but sometimes a change in your breasts can be a sign of a serious medical problem. It is important to learn how to do this procedure correctly so that you can catch problems early, when treatment is more likely to be successful. All women should practice breast self-awareness, including women who have had breast implants. What you need:  A mirror.  A well-lit room. How to do a breast  self-exam A breast self-exam is one way to learn what is normal for your breasts and whether your breasts are changing. To do a breast self-exam: Look for changes  1. Remove all the clothing above your waist. 2. Stand in front of a mirror in a room with good lighting. 3. Put your hands on your hips. 4. Push your hands firmly downward. 5. Compare your breasts in the mirror. Look for differences between them (asymmetry), such as: ? Differences in shape. ? Differences in size. ? Puckers, dips, and bumps in one breast and not the other. 6. Look at each breast for changes in the skin, such as: ? Redness. ? Scaly areas. 7. Look for changes in your nipples, such as: ? Discharge. ? Bleeding. ? Dimpling. ? Redness. ? A change in position. Feel for changes Carefully feel your breasts for lumps and changes. It is best to do this while lying on your back on the floor, and again while sitting or standing in the tub or shower with soapy water on your skin. Feel each breast in the following way: 1. Place the arm on the side of the breast you are examining above your head. 2. Feel your breast with the other hand. 3. Start in the nipple area and make -inch (2 cm) overlapping circles to feel your breast. Use the pads of your three middle fingers to do this. Apply light pressure, then medium pressure, then firm pressure. The light pressure will allow you to feel the tissue closest to the skin. The medium pressure will allow you to   the tissue that is a little deeper. The firm pressure will allow you to feel the tissue close to the ribs. 4. Continue the overlapping circles, moving downward over the breast until you feel your ribs below your breast. 5. Move one finger-width toward the center of the body. Continue to use the -inch (2 cm) overlapping circles to feel your breast as you move slowly up toward your collarbone. 6. Continue the up-and-down exam using all three pressures until you reach your  armpit.  Write down what you find Writing down what you find can help you remember what to discuss with your health care provider. Write down:  What is normal for each breast.  Any changes that you find in each breast, including: ? The kind of changes you find. ? Any pain or tenderness. ? Size and location of any lumps.  Where you are in your menstrual cycle, if you are still menstruating. General tips and recommendations  Examine your breasts every month.  If you are breastfeeding, the best time to examine your breasts is after a feeding or after using a breast pump.  If you menstruate, the best time to examine your breasts is 5-7 days after your period. Breasts are generally lumpier during menstrual periods, and it may be more difficult to notice changes.  With time and practice, you will become more familiar with the variations in your breasts and more comfortable with the exam. Contact a health care provider if you:  See a change in the shape or size of your breasts or nipples.  See a change in the skin of your breast or nipples, such as a reddened or scaly area.  Have unusual discharge from your nipples.  Find a lump or thick area that was not there before.  Have pain in your breasts.  Have any concerns related to your breast health. Summary  Breast self-awareness includes looking for physical changes in your breasts, as well as feeling for any changes within your breasts.  Breast self-awareness should be performed in front of a mirror in a well-lit room.  You should examine your breasts every month. If you menstruate, the best time to examine your breasts is 5-7 days after your menstrual period.  Let your health care provider know of any changes you notice in your breasts, including changes in size, changes on the skin, pain or tenderness, or unusual fluid from your nipples. This information is not intended to replace advice given to you by your health care provider.  Make sure you discuss any questions you have with your health care provider. Document Revised: 02/07/2018 Document Reviewed: 02/07/2018 Elsevier Patient Education  2020 [employer].    Migraine Headache A migraine headache is a very strong throbbing pain on one side or both sides of your head. This type of headache can also cause other symptoms. It can last from 4 hours to 3 days. Talk with your doctor about what things may bring on (trigger) this condition. What are the causes? The exact cause of this condition is not known. This condition may be triggered or caused by:  Drinking alcohol.  Smoking.  Taking medicines, such as: ? Medicine used to treat chest pain (nitroglycerin). ? Birth control pills. ? Estrogen. ? Some blood pressure medicines.  Eating or drinking certain products.  Doing physical activity. Other things that may trigger a migraine headache include:  Having a menstrual period.  Pregnancy.  Hunger.  Stress.  Not getting enough sleep or getting too much  sleep.  Weather changes.  Tiredness (fatigue). What increases the risk?  Being 58-43 years old.  Being female.  Having a family history of migraine headaches.  Being Caucasian.  Having depression or anxiety.  Being very overweight. What are the signs or symptoms?  A throbbing pain. This pain may: ? Happen in any area of the head, such as on one side or both sides. ? Make it hard to do daily activities. ? Get worse with physical activity. ? Get worse around bright lights or loud noises.  Other symptoms may include: ? Feeling sick to your stomach (nauseous). ? Vomiting. ? Dizziness. ? Being sensitive to bright lights, loud noises, or smells.  Before you get a migraine headache, you may get warning signs (an aura). An aura may include: ? Seeing flashing lights or having blind spots. ? Seeing bright spots, halos, or zigzag lines. ? Having tunnel vision or blurred vision. ? Having  numbness or a tingling feeling. ? Having trouble talking. ? Having weak muscles.  Some people have symptoms after a migraine headache (postdromal phase), such as: ? Tiredness. ? Trouble thinking (concentrating). How is this treated?  Taking medicines that: ? Relieve pain. ? Relieve the feeling of being sick to your stomach. ? Prevent migraine headaches.  Treatment may also include: ? Having acupuncture. ? Avoiding foods that bring on migraine headaches. ? Learning ways to control your body functions (biofeedback). ? Therapy to help you know and deal with negative thoughts (cognitive behavioral therapy). Follow these instructions at home: Medicines  Take over-the-counter and prescription medicines only as told by your doctor.  Ask your doctor if the medicine prescribed to you: ? Requires you to avoid driving or using heavy machinery. ? Can cause trouble pooping (constipation). You may need to take these steps to prevent or treat trouble pooping:  Drink enough fluid to keep your pee (urine) pale yellow.  Take over-the-counter or prescription medicines.  Eat foods that are high in fiber. These include beans, whole grains, and fresh fruits and vegetables.  Limit foods that are high in fat and sugar. These include fried or sweet foods. Lifestyle  Do not drink alcohol.  Do not use any products that contain nicotine or tobacco, such as cigarettes, e-cigarettes, and chewing tobacco. If you need help quitting, ask your doctor.  Get at least 8 hours of sleep every night.  Limit and deal with stress. General instructions      Keep a journal to find out what may bring on your migraine headaches. For example, write down: ? What you eat and drink. ? How much sleep you get. ? Any change in what you eat or drink. ? Any change in your medicines.  If you have a migraine headache: ? Avoid things that make your symptoms worse, such as bright lights. ? It may help to lie down in a  dark, quiet room. ? Do not drive or use heavy machinery. ? Ask your doctor what activities are safe for you.  Keep all follow-up visits as told by your doctor. This is important. Contact a doctor if:  You get a migraine headache that is different or worse than others you have had.  You have more than 15 headache days in one month. Get help right away if:  Your migraine headache gets very bad.  Your migraine headache lasts longer than 72 hours.  You have a fever.  You have a stiff neck.  You have trouble seeing.  Your muscles feel weak  or like you cannot control them.  You start to lose your balance a lot.  You start to have trouble walking.  You pass out (faint).  You have a seizure. Summary  A migraine headache is a very strong throbbing pain on one side or both sides of your head. These headaches can also cause other symptoms.  This condition may be treated with medicines and changes to your lifestyle.  Keep a journal to find out what may bring on your migraine headaches.  Contact a doctor if you get a migraine headache that is different or worse than others you have had.  Contact your doctor if you have more than 15 headache days in a month. This information is not intended to replace advice given to you by your health care provider. Make sure you discuss any questions you have with your health care provider. Document Revised: 10/13/2018 Document Reviewed: 08/03/2018 Elsevier Patient Education  [place].

## 2020-05-08 ENCOUNTER — Other Ambulatory Visit: Payer: Self-pay

## 2020-05-08 ENCOUNTER — Ambulatory Visit (INDEPENDENT_AMBULATORY_CARE_PROVIDER_SITE_OTHER): Payer: Managed Care, Other (non HMO) | Admitting: Obstetrics and Gynecology

## 2020-05-08 ENCOUNTER — Encounter: Payer: Self-pay | Admitting: Obstetrics and Gynecology

## 2020-05-08 VITALS — BP 124/84 | HR 84 | Ht 63.0 in | Wt 150.2 lb

## 2020-05-08 DIAGNOSIS — Z3041 Encounter for surveillance of contraceptive pills: Secondary | ICD-10-CM

## 2020-05-08 DIAGNOSIS — G43001 Migraine without aura, not intractable, with status migrainosus: Secondary | ICD-10-CM | POA: Diagnosis not present

## 2020-05-08 DIAGNOSIS — Z01419 Encounter for gynecological examination (general) (routine) without abnormal findings: Secondary | ICD-10-CM

## 2020-05-08 MED ORDER — NORETHIN-ETH ESTRAD TRIPHASIC 0.5/0.75/1-35 MG-MCG PO TABS: 1.0000 | ORAL_TABLET | Freq: Every day | ORAL | 4 refills | Status: DC

## 2020-05-08 NOTE — Progress Notes (Signed)
GYNECOLOGY ANNUAL PHYSICAL EXAM PROGRESS NOTE  Subjective:    Emily Dean is a 29 y.o. G27P1011 female who presents for an annual exam.  The patient is sexually active.  The patient wears seatbelts: yes. The patient participates in regular exercise: yes, minimal. Has the patient ever been transfused or tattooed?: yes (professional tattoos). The patient reports that there is not domestic violence in her life.   Of note, patient reports she is getting married in May (has had to postpone due to COVID)  Concerns: 1. Migraines: Patient states she has had migraines since being in high school. States may be associated with birth control as they started around the time she stated birth control. States experiences them 1-2x/week and more frequently around her cycle. She describes the pain as a throbbing without aura, located on the left side of her head and between her eyebrows. Has tried Excedrin and sleep which helps minimally but do not take it away fully. Does not know of any specific triggers. Has been to [facility] in [place].      Gynecologic History Age of menarche: 16  Patient's last menstrual period was 04/14/2020. Contraception: combined OCPs  History of STI's: Denies Last Pap: 10/26/2017. Results were: normal.  Denies h/o abnormal pap smears.    OB History  Gravida Para Term Preterm AB Living  2 1 1  0 1 1  SAB TAB Ectopic Multiple Live Births  0 1 0 0 1    # Outcome Date GA Lbr Len/2nd Weight Sex Delivery Anes PTL Lv  2 Term 10/28/16 [redacted]w[redacted]d / 00:23 5 lb 12.1 oz (2.61 kg) M Vag-Spont None  LIV     Name: Nazario,BOY Yasaman     Apgar1: 8  Apgar5: 9  1 TAB 2008            Past Medical History:  Diagnosis Date  . Migraine     Past Surgical History:  Procedure Laterality Date  . NO PAST SURGERIES      Family History  Problem Relation Age of Onset  . Rheum arthritis Mother   . Diabetes Father   . Thyroid disease Father   . Breast cancer Neg Hx     . Ovarian cancer Neg Hx   . Colon cancer Neg Hx     Social History   Socioeconomic History  . Marital status: Single    Spouse name: Not on file  . Number of children: Not on file  . Years of education: Not on file  . Highest education level: Not on file  Occupational History  . Not on file  Tobacco Use  . Smoking status: Never Smoker  . Smokeless tobacco: Never Used  Vaping Use  . Vaping Use: Never used  Substance and Sexual Activity  . Alcohol use: No  . Drug use: No  . Sexual activity: Yes    Birth control/protection: Condom, Pill  Other Topics Concern  . Not on file  Social History Narrative  . Not on file   Social Determinants of Health   Financial Resource Strain:   . Difficulty of Paying Living Expenses: Not on file  Food Insecurity:   . Worried About 2009 in the Last Year: Not on file  . Ran Out of Food in the Last Year: Not on file  Transportation Needs:   . Lack of Transportation (Medical): Not on file  . Lack of Transportation (Non-Medical): Not on file  Physical Activity:   .  Days of Exercise per Week: Not on file  . Minutes of Exercise per Session: Not on file  Stress:   . Feeling of Stress : Not on file  Social Connections:   . Frequency of Communication with Friends and Family: Not on file  . Frequency of Social Gatherings with Friends and Family: Not on file  . Attends Religious Services: Not on file  . Active Member of Clubs or Organizations: Not on file  . Attends Banker Meetings: Not on file  . Marital Status: Not on file  Intimate Partner Violence:   . Fear of Current or Ex-Partner: Not on file  . Emotionally Abused: Not on file  . Physically Abused: Not on file  . Sexually Abused: Not on file    No current outpatient medications on file prior to visit.   No current facility-administered medications on file prior to visit.    No Known Allergies    Review of Systems Constitutional: negative for  chills, fatigue, fevers and sweats Eyes: negative for irritation, redness and visual disturbance Ears, nose, mouth, throat, and face: negative for hearing loss, nasal congestion, snoring and tinnitus Respiratory: negative for asthma, cough, sputum Cardiovascular: negative for chest pain, dyspnea, exertional chest pressure/discomfort, irregular heart beat, palpitations and syncope Gastrointestinal: negative for abdominal pain, change in bowel habits, nausea and vomiting Genitourinary: negative for abnormal menstrual periods, genital lesions, sexual problems and vaginal discharge, dysuria and urinary incontinence Integument/breast: negative for breast lump, breast tenderness and nipple discharge Hematologic/lymphatic: negative for bleeding and easy bruising Musculoskeletal:negative for back pain and muscle weakness Neurological: negative for dizziness, vertigo and weakness. headaches Endocrine: negative for diabetic symptoms including polydipsia, polyuria and skin dryness Allergic/Immunologic: negative for hay fever and urticaria       Objective:  Blood pressure 124/84, pulse 84, height 5\' 3"  (1.6 m), weight 150 lb 3.2 oz (68.1 kg), last menstrual period 04/14/2020. Body mass index is 26.61 kg/m.  General Appearance:    Alert, cooperative, no distress, appears stated age, overweight  Head:    Normocephalic, without obvious abnormality, atraumatic  Eyes:    PERRL, conjunctiva/corneas clear, EOM's intact, both eyes  Ears:    Normal external ear canals, both ears  Nose:   Nares normal, septum midline, mucosa normal, no drainage or sinus tenderness  Throat:   Lips, mucosa, and tongue normal; teeth and gums normal  Neck:   Supple, symmetrical, trachea midline, no adenopathy; thyroid: no enlargement/tenderness/nodules; no carotid bruit or JVD  Back:     Symmetric, no curvature, ROM normal, no CVA tenderness  Lungs:     Clear to auscultation bilaterally, respirations unlabored  Chest Wall:    No  tenderness or deformity   Heart:    Regular rate and rhythm, S1 and S2 normal, no murmur, rub or gallop  Breast Exam:    No tenderness, masses, or nipple abnormality  Abdomen:     Soft, non-tender, bowel sounds active all four quadrants, no masses, no organomegaly.    Genitalia:    Pelvic:external genitalia normal, vagina without lesions, discharge, or tenderness, rectovaginal septum  normal. Cervix normal in appearance, no cervical motion tenderness, no adnexal masses or tenderness.  Uterus normal size, shape, mobile, regular contours, nontender.  Rectal:    Normal external sphincter.  No hemorrhoids appreciated. Internal exam not done.   Extremities:   Extremities normal, atraumatic, no cyanosis or edema  Pulses:   2+ and symmetric all extremities  Skin:   Skin color, texture, turgor normal,  no rashes or lesions  Lymph nodes:   Cervical, supraclavicular, and axillary nodes normal  Neurologic:   CNII-XII intact, normal strength, sensation and reflexes throughout   .  Labs:    Lab Results  Component Value Date   WBC 7.9 04/11/2019   HGB 13.6 04/11/2019   HCT 40.6 04/11/2019   MCV 90 04/11/2019   PLT 274 04/11/2019    Lab Results  Component Value Date   CREATININE 0.78 04/11/2019   BUN 9 04/11/2019   NA 139 04/11/2019   K 4.1 04/11/2019   CL 102 04/11/2019   CO2 23 04/11/2019    Lab Results  Component Value Date   ALT 16 04/11/2019   AST 20 04/11/2019   ALKPHOS 71 04/11/2019   BILITOT <0.2 04/11/2019    No results found for: TSH   Assessment:   1. Healthy female exam.   2. Contraception  3. Overweight  4. Migraines without aura    Plan:  Routine Gynecologic Exam:  -Blood tests: CBC with diff and Comprehensive metabolic panel. -Breast self exam technique reviewed and patient encouraged to perform self-exam monthly. Patient performs monthly breast exams.  -Pap smear up to date.  -Contraception: combined OCPs currently.    -Declines flu vaccine today. Will get  later in the season.  -COVID vaccine: declines.  -Discussed healthy lifestyle modifications.   Migraine without aura: - Patient has been experiencing migraines since high school around the time she was started on birth control. Currently on OCP with moderate dose of estrogen. Discussed option of progesterone-only contraception to see if this will improve her migraines. Discussed options of IUD, POPs, Nexplanon, Will try trial of progestin only pill, Sylnd 30 day sample provided.  If it helps, can change to POPs.  If not, can consider other treatments for migraine (i.e. triptans).    Follow up: in 1 year, or sooner if needed.    Hildred Laser, MD [facility]

## 2020-05-09 LAB — COMPREHENSIVE METABOLIC PANEL
ALT: 14 IU/L (ref 0–32)
AST: 16 IU/L (ref 0–40)
Albumin/Globulin Ratio: 1.6 (ref 1.2–2.2)
Albumin: 4.6 g/dL (ref 3.9–5.0)
Alkaline Phosphatase: 65 IU/L (ref 44–121)
BUN/Creatinine Ratio: 11 (ref 9–23)
BUN: 8 mg/dL (ref 6–20)
Bilirubin Total: 0.3 mg/dL (ref 0.0–1.2)
CO2: 24 mmol/L (ref 20–29)
Calcium: 9.8 mg/dL (ref 8.7–10.2)
Chloride: 98 mmol/L (ref 96–106)
Creatinine, Ser: 0.76 mg/dL (ref 0.57–1.00)
GFR calc Af Amer: 123 mL/min/{1.73_m2} (ref >59)
GFR calc non Af Amer: 107 mL/min/{1.73_m2} (ref >59)
Globulin, Total: 2.8 g/dL (ref 1.5–4.5)
Glucose: 75 mg/dL (ref 65–99)
Potassium: 4.4 mmol/L (ref 3.5–5.2)
Sodium: 139 mmol/L (ref 134–144)
Total Protein: 7.4 g/dL (ref 6.0–8.5)

## 2020-05-09 LAB — CBC
Hematocrit: 42.3 % (ref 34.0–46.6)
Hemoglobin: 14.3 g/dL (ref 11.1–15.9)
MCH: 30.9 pg (ref 26.6–33.0)
MCHC: 33.8 g/dL (ref 31.5–35.7)
MCV: 91 fL (ref 79–97)
Platelets: 278 10*3/uL (ref 150–450)
RBC: 4.63 x10E6/uL (ref 3.77–5.28)
RDW: 12.2 % (ref 11.7–15.4)
WBC: 7.4 10*3/uL (ref 3.4–10.8)

## 2020-06-15 ENCOUNTER — Other Ambulatory Visit: Payer: Self-pay | Admitting: Obstetrics and Gynecology

## 2020-06-16 NOTE — Telephone Encounter (Signed)
Patient tried Slynd to see if it would help with her migraines.  Does she want to continue the progesterone only pills (Slynd) or go back to her old pill (although it may aggrevate her migraines).  Also, there is a lower dose combined pill she could consider (Lo-loestrin).   Dr. Valentino Saxon

## 2020-06-17 NOTE — Telephone Encounter (Signed)
Spoke to pt concerning medication of birth control and which one did she want. Pt stated that she would like to continue with the [facility] rx sent to pharmacy [facility] [address].

## 2020-06-17 NOTE — Telephone Encounter (Signed)
Pt wanted to stay on Slynd rx sent to pharmacy.

## 2020-09-22 ENCOUNTER — Other Ambulatory Visit: Payer: Self-pay

## 2020-09-22 MED ORDER — FLUCONAZOLE 150 MG PO TABS: 150.0000 mg | ORAL_TABLET | Freq: Once | ORAL | 0 refills | Status: AC

## 2021-04-15 ENCOUNTER — Other Ambulatory Visit: Payer: Self-pay

## 2021-04-15 MED ORDER — SLYND 4 MG PO TABS: 4.0000 mg | ORAL_TABLET | Freq: Every day | ORAL | 3 refills | Status: DC

## 2021-05-20 ENCOUNTER — Encounter: Payer: Managed Care, Other (non HMO) | Admitting: Obstetrics and Gynecology

## 2021-07-07 ENCOUNTER — Telehealth: Payer: Self-pay | Admitting: Obstetrics and Gynecology

## 2021-07-07 NOTE — Telephone Encounter (Signed)
Pt called to move her physical - she is rescheduled for 02-10- she is requesting a refill on birth control to get her to new apt. [facility] [address]. Please Advise.

## 2021-07-09 NOTE — Telephone Encounter (Signed)
Pt has cancelled past 2 previously scheduled annuals, up to provider if further refills allowed without being seen in office.

## 2021-07-10 ENCOUNTER — Encounter: Payer: Managed Care, Other (non HMO) | Admitting: Obstetrics and Gynecology

## 2021-07-13 NOTE — Telephone Encounter (Signed)
Pt is scheduled for a physical on 2/10. 

## 2021-08-14 ENCOUNTER — Other Ambulatory Visit: Payer: Self-pay

## 2021-08-14 ENCOUNTER — Ambulatory Visit (INDEPENDENT_AMBULATORY_CARE_PROVIDER_SITE_OTHER): Payer: BC Managed Care – PPO | Admitting: Obstetrics and Gynecology

## 2021-08-14 ENCOUNTER — Encounter: Payer: Self-pay | Admitting: Obstetrics and Gynecology

## 2021-08-14 VITALS — BP 122/90 | HR 96 | Ht 64.5 in | Wt 154.0 lb

## 2021-08-14 DIAGNOSIS — Z30013 Encounter for initial prescription of injectable contraceptive: Secondary | ICD-10-CM

## 2021-08-14 DIAGNOSIS — Z01419 Encounter for gynecological examination (general) (routine) without abnormal findings: Secondary | ICD-10-CM

## 2021-08-14 MED ORDER — MEDROXYPROGESTERONE ACETATE 150 MG/ML IM SUSP: 150.0000 mg | INTRAMUSCULAR | 3 refills | Status: DC

## 2021-08-14 MED ORDER — MEDROXYPROGESTERONE ACETATE 150 MG/ML IM SUSP
150.0000 mg | Freq: Once | INTRAMUSCULAR | Status: AC
  Administered 2021-08-14: 150 mg via INTRAMUSCULAR

## 2021-08-14 NOTE — Progress Notes (Addendum)
GYNECOLOGY ANNUAL PHYSICAL EXAM PROGRESS NOTE  Subjective:    Emily Dean is a 31 y.o. G51P1011 female who presents for an annual exam.  The patient is sexually active.  The patient wears seatbelts: yes. The patient participates in regular exercise: yes. Has the patient ever been transfused or tattooed?: yes (professional tattoos). The patient reports that there is not domestic violence in her life.    Concerns: 1. Desires to change contraception methods. Currently on progesterone OCPs due to migraines and issues with combined pills. Currently noting that she sometimes forgets her pills. Would like to consider returning to Depo Provera.    Gynecologic History Age of menarche: 69  Patient's last menstrual period was 07/20/2021. Contraception: progesterone- OCPs  History of STI's: Denies Last Pap: 10/26/2017. Results were: normal.  Denies h/o abnormal pap smears.     Upstream - 08/14/21 0820       Pregnancy Intention Screening   Does the patient want to become pregnant in the next year? Yes    Does the patient's partner want to become pregnant in the next year? Yes    Would the patient like to discuss contraceptive options today? Yes      Contraception Wrap Up   Current Method Oral Contraceptive    Contraception Counseling Provided Yes            The pregnancy intention screening data noted above was reviewed. Potential methods of contraception were discussed. The patient elected to proceed with Hormonal Implant.   OB History  Gravida Para Term Preterm AB Living  2 1 1  0 1 1  SAB IAB Ectopic Multiple Live Births  0 1 0 0 1    # Outcome Date GA Lbr Len/2nd Weight Sex Delivery Anes PTL Lv  2 Term 10/28/16 [redacted]w[redacted]d / 00:23 5 lb 12.1 oz (2.61 kg) M Vag-Spont None  LIV     Name: Ransdell,BOY Meiling     Apgar1: 8  Apgar5: 9  1 IAB 2008            Past Medical History:  Diagnosis Date   Migraine     Past Surgical History:  Procedure Laterality Date   NO PAST  SURGERIES      Family History  Problem Relation Age of Onset   Rheum arthritis Mother    Diabetes Father    Thyroid disease Father    Breast cancer Neg Hx    Ovarian cancer Neg Hx    Colon cancer Neg Hx     Social History   Socioeconomic History   Marital status: Single    Spouse name: Not on file   Number of children: Not on file   Years of education: Not on file   Highest education level: Not on file  Occupational History   Not on file  Tobacco Use   Smoking status: Never   Smokeless tobacco: Never  Vaping Use   Vaping Use: Never used  Substance and Sexual Activity   Alcohol use: No   Drug use: No   Sexual activity: Yes    Birth control/protection: Condom, Pill  Other Topics Concern   Not on file  Social History Narrative   Not on file   Social Determinants of Health   Financial Resource Strain: Not on file  Food Insecurity: Not on file  Transportation Needs: Not on file  Physical Activity: Not on file  Stress: Not on file  Social Connections: Not on file  Intimate Partner Violence: Not  on file    Current Outpatient Medications on File Prior to Visit  Medication Sig Dispense Refill   Drospirenone (SLYND) 4 MG TABS Take 4 mg by mouth daily. 28 tablet 3   No current facility-administered medications on file prior to visit.    No Known Allergies    Review of Systems Constitutional: negative for chills, fatigue, fevers and sweats Eyes: negative for irritation, redness and visual disturbance Ears, nose, mouth, throat, and face: negative for hearing loss, nasal congestion, snoring and tinnitus Respiratory: negative for asthma, cough, sputum Cardiovascular: negative for chest pain, dyspnea, exertional chest pressure/discomfort, irregular heart beat, palpitations and syncope Gastrointestinal: negative for abdominal pain, change in bowel habits, nausea and vomiting Genitourinary: negative for abnormal menstrual periods, genital lesions, sexual problems and  vaginal discharge, dysuria and urinary incontinence Integument/breast: negative for breast lump, breast tenderness and nipple discharge Hematologic/lymphatic: negative for bleeding and easy bruising Musculoskeletal:negative for back pain and muscle weakness Neurological: negative for dizziness, vertigo and weakness.  Endocrine: negative for diabetic symptoms including polydipsia, polyuria and skin dryness Allergic/Immunologic: negative for hay fever and urticaria       Objective:  Blood pressure 122/90, pulse 96, height 5' 4.5" (1.638 m), weight 154 lb (69.9 kg), last menstrual period 07/20/2021, SpO2 98 %. Body mass index is 26.03 kg/m.  General Appearance:    Alert, cooperative, no distress, appears stated age, overweight  Head:    Normocephalic, without obvious abnormality, atraumatic  Eyes:    PERRL, conjunctiva/corneas clear, EOM's intact, both eyes  Ears:    Normal external ear canals, both ears  Nose:   Nares normal, septum midline, mucosa normal, no drainage or sinus tenderness  Throat:   Lips, mucosa, and tongue normal; teeth and gums normal  Neck:   Supple, symmetrical, trachea midline, no adenopathy; thyroid: no enlargement/tenderness/nodules; no carotid bruit or JVD  Back:     Symmetric, no curvature, ROM normal, no CVA tenderness  Lungs:     Clear to auscultation bilaterally, respirations unlabored  Chest Wall:    No tenderness or deformity   Heart:    Regular rate and rhythm, S1 and S2 normal, no murmur, rub or gallop  Breast Exam:    No tenderness, masses, or nipple abnormality  Abdomen:     Soft, non-tender, bowel sounds active all four quadrants, no masses, no organomegaly.    Genitalia:    Pelvic:external genitalia normal, vagina without lesions, discharge, or tenderness, rectovaginal septum  normal. Cervix normal in appearance, no cervical motion tenderness, no adnexal masses or tenderness.  Uterus normal size, shape, mobile, regular contours, nontender.  Rectal:     Normal external sphincter.  No hemorrhoids appreciated. Internal exam not done.   Extremities:   Extremities normal, atraumatic, no cyanosis or edema  Pulses:   2+ and symmetric all extremities  Skin:   Skin color, texture, turgor normal, no rashes or lesions  Lymph nodes:   Cervical, supraclavicular, and axillary nodes normal  Neurologic:   CNII-XII intact, normal strength, sensation and reflexes throughout   .  Labs:    Lab Results  Component Value Date   WBC 7.4 05/08/2020   HGB 14.3 05/08/2020   HCT 42.3 05/08/2020   MCV 91 05/08/2020   PLT 278 05/08/2020    Lab Results  Component Value Date   CREATININE 0.76 05/08/2020   BUN 8 05/08/2020   NA 139 05/08/2020   K 4.4 05/08/2020   CL 98 05/08/2020   CO2 24 05/08/2020  Lab Results  Component Value Date   ALT 14 05/08/2020   AST 16 05/08/2020   ALKPHOS 65 05/08/2020   BILITOT 0.3 05/08/2020    No results found for: TSH   Assessment:   1. Healthy female exam.   2. Contraception management 3. Overweight     Plan:  Routine Gynecologic Exam:  -Blood tests: TSH, CBC with diff and Comprehensive metabolic panel. -Breast self exam technique reviewed and patient encouraged to perform self-exam monthly. Patient performs monthly breast exams.  -Pap smear up to date. To perform in 1 year.  -Contraception: progesterone-OCPs currently.  Desires to transition to something requiring less management. Discussed all options. Patient desires to return to Depo Provera. Will give initial shot today, RTC in 3 months. Advised on back up method for 1 week.  -Flu vaccine: up to date -COVID vaccine: up to date.  -Discussed healthy lifestyle modifications.    Follow up: in 1 year, or sooner if needed.    Rubie Maid, MD [facility]

## 2021-08-15 LAB — COMPREHENSIVE METABOLIC PANEL
ALT: 12 IU/L (ref 0–32)
AST: 18 IU/L (ref 0–40)
Albumin/Globulin Ratio: 1.8 (ref 1.2–2.2)
Albumin: 4.4 g/dL (ref 3.9–5.0)
Alkaline Phosphatase: 78 IU/L (ref 44–121)
BUN/Creatinine Ratio: 18 (ref 9–23)
BUN: 11 mg/dL (ref 6–20)
Bilirubin Total: 0.4 mg/dL (ref 0.0–1.2)
CO2: 20 mmol/L (ref 20–29)
Calcium: 9.2 mg/dL (ref 8.7–10.2)
Chloride: 103 mmol/L (ref 96–106)
Creatinine, Ser: 0.62 mg/dL (ref 0.57–1.00)
Globulin, Total: 2.4 g/dL (ref 1.5–4.5)
Glucose: 79 mg/dL (ref 70–99)
Potassium: 4.4 mmol/L (ref 3.5–5.2)
Sodium: 139 mmol/L (ref 134–144)
Total Protein: 6.8 g/dL (ref 6.0–8.5)
eGFR: 123 mL/min/{1.73_m2} (ref >59)

## 2021-08-15 LAB — CBC
Hematocrit: 41.3 % (ref 34.0–46.6)
Hemoglobin: 13.9 g/dL (ref 11.1–15.9)
MCH: 30.2 pg (ref 26.6–33.0)
MCHC: 33.7 g/dL (ref 31.5–35.7)
MCV: 90 fL (ref 79–97)
Platelets: 274 10*3/uL (ref 150–450)
RBC: 4.61 x10E6/uL (ref 3.77–5.28)
RDW: 11.8 % (ref 11.7–15.4)
WBC: 6.9 10*3/uL (ref 3.4–10.8)

## 2021-08-15 LAB — TSH: TSH: 1.17 u[IU]/mL (ref 0.450–4.500)

## 2021-09-01 ENCOUNTER — Other Ambulatory Visit: Payer: Self-pay | Admitting: Obstetrics and Gynecology

## 2021-11-06 ENCOUNTER — Ambulatory Visit: Payer: BC Managed Care – PPO

## 2022-01-19 ENCOUNTER — Encounter: Payer: Self-pay | Admitting: Obstetrics and Gynecology

## 2022-04-08 ENCOUNTER — Encounter: Payer: Self-pay | Admitting: Obstetrics and Gynecology

## 2022-04-08 MED ORDER — MEDROXYPROGESTERONE ACETATE 150 MG/ML IM SUSP: 150.0000 mg | INTRAMUSCULAR | 1 refills | Status: DC

## 2022-04-19 ENCOUNTER — Ambulatory Visit (INDEPENDENT_AMBULATORY_CARE_PROVIDER_SITE_OTHER): Payer: BC Managed Care – PPO

## 2022-04-19 VITALS — BP 122/84 | HR 87 | Resp 16 | Ht 63.0 in | Wt 170.3 lb

## 2022-04-19 DIAGNOSIS — Z3042 Encounter for surveillance of injectable contraceptive: Secondary | ICD-10-CM | POA: Diagnosis not present

## 2022-04-19 MED ORDER — MEDROXYPROGESTERONE ACETATE 150 MG/ML IM SUSP
150.0000 mg | INTRAMUSCULAR | Status: AC
  Administered 2022-04-19 – 2022-09-27 (×2): 150 mg via INTRAMUSCULAR

## 2022-04-19 NOTE — Patient Instructions (Signed)
Medroxyprogesterone Injection (Contraception) ?What is this medication? ?MEDROXYPROGESTERONE (me DROX ee proe JES te rone) prevents ovulation and pregnancy. It belongs to a group of medications called contraceptives. This medication is a progestin hormone. ?This medicine may be used for other purposes; ask your health care provider or pharmacist if you have questions. ?COMMON BRAND NAME(S): Depo-Provera, Depo-subQ Provera 104 ?What should I tell my care team before I take this medication? ?They need to know if you have any of these conditions: ?Asthma ?Blood clots ?Breast cancer or family history of breast cancer ?Depression ?Diabetes ?Eating disorder (anorexia nervosa) ?Heart attack ?High blood pressure ?HIV infection or AIDS ?If you often drink alcohol ?Kidney disease ?Liver disease ?Migraine headaches ?Osteoporosis, weak bones ?Seizures ?Stroke ?Tobacco smoker ?Vaginal bleeding ?An unusual or allergic reaction to medroxyprogesterone, other hormones, medications, foods, dyes, or preservatives ?Pregnant or trying to get pregnant ?Breast-feeding ?How should I use this medication? ?Depo-Provera CI contraceptive injection is given into a muscle. Depo-subQ Provera 104 injection is given under the skin. It is given in a hospital or clinic setting. The injection is usually given during the first 5 days after the start of a menstrual period or 6 weeks after delivery of a baby. ?A patient package insert for the product will be given with each prescription and refill. Be sure to read this information carefully each time. The sheet may change often. ?Talk to your care team about the use of this medication in children. Special care may be needed. These injections have been used in female children who have started having menstrual periods. ?Overdosage: If you think you have taken too much of this medicine contact a poison control center or emergency room at once. ?NOTE: This medicine is only for you. Do not share this medicine  with others. ?What if I miss a dose? ?Keep appointments for follow-up doses. You must get an injection once every 3 months. It is important not to miss your dose. Call your care team if you are unable to keep an appointment. ?What may interact with this medication? ?Antibiotics or medications for infections, especially rifampin and griseofulvin ?Antivirals for HIV or hepatitis ?Aprepitant ?Armodafinil ?Bexarotene ?Bosentan ?Medications for seizures like carbamazepine, felbamate, oxcarbazepine, phenytoin, phenobarbital, primidone, topiramate ?Mitotane ?Modafinil ?St. John's wort ?This list may not describe all possible interactions. Give your health care provider a list of all the medicines, herbs, non-prescription drugs, or dietary supplements you use. Also tell them if you smoke, drink alcohol, or use illegal drugs. Some items may interact with your medicine. ?What should I watch for while using this medication? ?This medication does not protect you against HIV infection (AIDS) or other sexually transmitted diseases. ?Use of this product may cause you to lose calcium from your bones. Loss of calcium may cause weak bones (osteoporosis). Only use this product for more than 2 years if other forms of birth control are not right for you. The longer you use this product for birth control the more likely you will be at risk for weak bones. Ask your care team how you can keep strong bones. ?You may have a change in bleeding pattern or irregular periods. Many females stop having periods while taking this medication. ?If you have received your injections on time, your chance of being pregnant is very low. If you think you may be pregnant, see your care team as soon as possible. ?Tell your care team if you want to get pregnant within the next year. The effect of this medication may last a   long time after you get your last injection. ?What side effects may I notice from receiving this medication? ?Side effects that you should  report to your care team as soon as possible: ?Allergic reactions--skin rash, itching, hives, swelling of the face, lips, tongue, or throat ?Blood clot--pain, swelling, or warmth in the leg, shortness of breath, chest pain ?Gallbladder problems--severe stomach pain, nausea, vomiting, fever ?Increase in blood pressure ?Liver injury--right upper belly pain, loss of appetite, nausea, light-colored stool, dark yellow or brown urine, yellowing skin or eyes, unusual weakness or fatigue ?New or worsening migraines or headaches ?Seizures ?Stroke--sudden numbness or weakness of the face, arm, or leg, trouble speaking, confusion, trouble walking, loss of balance or coordination, dizziness, severe headache, change in vision ?Unusual vaginal discharge, itching, or odor ?Worsening mood, feelings of depression ?Side effects that usually do not require medical attention (report to your care team if they continue or are bothersome): ?Breast pain or tenderness ?Dark patches of the skin on the face or other sun-exposed areas ?Irregular menstrual cycles or spotting ?Nausea ?Weight gain ?This list may not describe all possible side effects. Call your doctor for medical advice about side effects. You may report side effects to FDA at 1-800-FDA-1088. ?Where should I keep my medication? ?This injection is only given by a care team. It will not be stored at home. ?NOTE: This sheet is a summary. It may not cover all possible information. If you have questions about this medicine, talk to your doctor, pharmacist, or health care provider. ?? 2023 Elsevier/Gold Standard (2020-08-24 00:00:00) ? ?

## 2022-04-19 NOTE — Progress Notes (Signed)
Date last pap: 10/26/2017. Last Depo-Provera: 01/31/2022. Side Effects if any: None. Serum HCG indicated? N/A. Depo-Provera 150 mg IM given by: Cristy Folks, CMA. Next appointment due Jan.1 - Jan. 15   Patient is aware that she needs an annual exam. She will schedule at check out today.

## 2022-07-07 ENCOUNTER — Ambulatory Visit (INDEPENDENT_AMBULATORY_CARE_PROVIDER_SITE_OTHER): Payer: BC Managed Care – PPO

## 2022-07-07 VITALS — Wt 172.2 lb

## 2022-07-07 DIAGNOSIS — Z3042 Encounter for surveillance of injectable contraceptive: Secondary | ICD-10-CM

## 2022-07-07 MED ORDER — MEDROXYPROGESTERONE ACETATE 150 MG/ML IM SUSP
150.0000 mg | Freq: Once | INTRAMUSCULAR | Status: AC
  Administered 2022-07-07: 150 mg via INTRAMUSCULAR

## 2022-07-07 NOTE — Patient Instructions (Signed)

## 2022-07-07 NOTE — Progress Notes (Signed)
Date last pap: 10/26/2017. Last Depo-Provera: 04/19/2022. Side Effects if any: none reported. Serum HCG indicated? N/A. Depo-Provera 150 mg IM given by: Nunzio Cory.W, NCMA. Next appointment due 3/21-10/07/2022.

## 2022-09-27 ENCOUNTER — Ambulatory Visit (INDEPENDENT_AMBULATORY_CARE_PROVIDER_SITE_OTHER): Payer: BC Managed Care – PPO

## 2022-09-27 VITALS — BP 112/78 | HR 88 | Ht 63.0 in | Wt 160.3 lb

## 2022-09-27 DIAGNOSIS — Z3042 Encounter for surveillance of injectable contraceptive: Secondary | ICD-10-CM

## 2022-09-27 NOTE — Progress Notes (Signed)
    NURSE VISIT NOTE  Subjective:    Patient ID: Emily Dean, female    DOB: [birth year], 32 y.o.   MRN: [id number]  HPI  Patient is a 32 y.o. G40P1011 female who presents for depo provera injection.   Objective:    BP 112/78   Pulse 88   Ht 5\' 3"  (1.6 m)   Wt 160 lb 4.8 oz (72.7 kg)   BMI 28.40 kg/m   Last Annual: 08/14/21. Last pap: 10/26/17. Last Depo-Provera: 07/07/22. Side Effects if any: n/a. Serum HCG indicated? No . Depo-Provera 150 mg IM given by: Douglass Rivers, CMA. Site: Right Upper Outer Quandrant   Assessment:   1. Encounter for management and injection of depo-Provera      Plan:   Next appointment due between 12/13/22 and 01/10/23. Annual scheduled.     Marykay Lex, CMA

## 2022-12-06 NOTE — Progress Notes (Unsigned)
GYNECOLOGY ANNUAL PHYSICAL EXAM PROGRESS NOTE  Subjective:    Emily Dean is a 32 y.o. G8P1011 female who presents for an annual exam. The patient has no complaints today. The patient {is/is not/has never been:13135} sexually active. The patient participates in regular exercise: {yes/no/not asked:9010}. Has the patient ever been transfused or tattooed?: {yes/no/not asked:9010}. The patient reports that there {is/is not:9024} domestic violence in her life.    Menstrual History: Menarche age: 52 No LMP recorded. Patient has had an injection.     Gynecologic History:  Contraception: OCP (estrogen/progesterone) History of STI's: Denies Last Pap: 10/26/2017. Results were: normal. Denies h/o abnormal pap smears.  Last mammogram: Not age appropriate.    OB History  Gravida Para Term Preterm AB Living  2 1 1  0 1 1  SAB IAB Ectopic Multiple Live Births  0 1 0 0 1    # Outcome Date GA Lbr Len/2nd Weight Sex Delivery Anes PTL Lv  2 Term 10/28/16 [redacted]w[redacted]d / 00:23 5 lb 12.1 oz (2.61 kg) M Vag-Spont None  LIV     Name: Blowe,BOY Devora     Apgar1: 8  Apgar5: 9  1 IAB 2008            Past Medical History:  Diagnosis Date   Migraine     Past Surgical History:  Procedure Laterality Date   NO PAST SURGERIES      Family History  Problem Relation Age of Onset   Rheum arthritis Mother    Diabetes Father    Thyroid disease Father    Breast cancer Neg Hx    Ovarian cancer Neg Hx    Colon cancer Neg Hx     Social History   Socioeconomic History   Marital status: Married    Spouse name: Not on file   Number of children: Not on file   Years of education: Not on file   Highest education level: Not on file  Occupational History   Not on file  Tobacco Use   Smoking status: Never   Smokeless tobacco: Never  Vaping Use   Vaping Use: Never used  Substance and Sexual Activity   Alcohol use: No   Drug use: No   Sexual activity: Yes    Birth control/protection: Condom,  Pill  Other Topics Concern   Not on file  Social History Narrative   Not on file   Social Determinants of Health   Financial Resource Strain: Not on file  Food Insecurity: Not on file  Transportation Needs: Not on file  Physical Activity: Not on file  Stress: Not on file  Social Connections: Not on file  Intimate Partner Violence: Not on file    Current Outpatient Medications on File Prior to Visit  Medication Sig Dispense Refill   medroxyPROGESTERone (DEPO-PROVERA) 150 MG/ML injection Inject 1 mL (150 mg total) into the muscle every 3 (three) months. 1 mL 1   Current Facility-Administered Medications on File Prior to Visit  Medication Dose Route Frequency Provider Last Rate Last Admin   medroxyPROGESTERone (DEPO-PROVERA) injection 150 mg  150 mg Intramuscular Q90 days Hildred Laser, MD   150 mg at 09/27/22 1610    No Known Allergies   Review of Systems Constitutional: negative for chills, fatigue, fevers and sweats Eyes: negative for irritation, redness and visual disturbance Ears, nose, mouth, throat, and face: negative for hearing loss, nasal congestion, snoring and tinnitus Respiratory: negative for asthma, cough, sputum Cardiovascular: negative for chest pain, dyspnea, exertional  chest pressure/discomfort, irregular heart beat, palpitations and syncope Gastrointestinal: negative for abdominal pain, change in bowel habits, nausea and vomiting Genitourinary: negative for abnormal menstrual periods, genital lesions, sexual problems and vaginal discharge, dysuria and urinary incontinence Integument/breast: negative for breast lump, breast tenderness and nipple discharge Hematologic/lymphatic: negative for bleeding and easy bruising Musculoskeletal:negative for back pain and muscle weakness Neurological: negative for dizziness, headaches, vertigo and weakness Endocrine: negative for diabetic symptoms including polydipsia, polyuria and skin dryness Allergic/Immunologic:  negative for hay fever and urticaria      Objective:  There were no vitals taken for this visit. There is no height or weight on file to calculate BMI.    General Appearance:    Alert, cooperative, no distress, appears stated age  Head:    Normocephalic, without obvious abnormality, atraumatic  Eyes:    PERRL, conjunctiva/corneas clear, EOM's intact, both eyes  Ears:    Normal external ear canals, both ears  Nose:   Nares normal, septum midline, mucosa normal, no drainage or sinus tenderness  Throat:   Lips, mucosa, and tongue normal; teeth and gums normal  Neck:   Supple, symmetrical, trachea midline, no adenopathy; thyroid: no enlargement/tenderness/nodules; no carotid bruit or JVD  Back:     Symmetric, no curvature, ROM normal, no CVA tenderness  Lungs:     Clear to auscultation bilaterally, respirations unlabored  Chest Wall:    No tenderness or deformity   Heart:    Regular rate and rhythm, S1 and S2 normal, no murmur, rub or gallop  Breast Exam:    No tenderness, masses, or nipple abnormality  Abdomen:     Soft, non-tender, bowel sounds active all four quadrants, no masses, no organomegaly.    Genitalia:    Pelvic:external genitalia normal, vagina without lesions, discharge, or tenderness, rectovaginal septum  normal. Cervix normal in appearance, no cervical motion tenderness, no adnexal masses or tenderness.  Uterus normal size, shape, mobile, regular contours, nontender.  Rectal:    Normal external sphincter.  No hemorrhoids appreciated. Internal exam not done.   Extremities:   Extremities normal, atraumatic, no cyanosis or edema  Pulses:   2+ and symmetric all extremities  Skin:   Skin color, texture, turgor normal, no rashes or lesions  Lymph nodes:   Cervical, supraclavicular, and axillary nodes normal  Neurologic:   CNII-XII intact, normal strength, sensation and reflexes throughout   .  Labs:  Lab Results  Component Value Date   WBC 6.9 08/14/2021   HGB 13.9 08/14/2021    HCT 41.3 08/14/2021   MCV 90 08/14/2021   PLT 274 08/14/2021    Lab Results  Component Value Date   CREATININE 0.62 08/14/2021   BUN 11 08/14/2021   NA 139 08/14/2021   K 4.4 08/14/2021   CL 103 08/14/2021   CO2 20 08/14/2021    Lab Results  Component Value Date   ALT 12 08/14/2021   AST 18 08/14/2021   ALKPHOS 78 08/14/2021   BILITOT 0.4 08/14/2021    Lab Results  Component Value Date   TSH 1.170 08/14/2021     Assessment:   No diagnosis found.   Plan:  Blood tests: {blood tests:13147}. Breast self exam technique reviewed and patient encouraged to perform self-exam monthly. Contraception: OCP (estrogen/progesterone). Discussed healthy lifestyle modifications. Mammogram  :Not age appropriate Pap smear ordered. COVID vaccination status: Follow up in 1 year for annual exam   Tommie Raymond, CMA [facility]

## 2022-12-06 NOTE — Patient Instructions (Incomplete)
Preventive Care 21-32 Years Old, Female Preventive care refers to lifestyle choices and visits with your health care provider that can promote health and wellness. Preventive care visits are also called wellness exams. What can I expect for my preventive care visit? Counseling During your preventive care visit, your health care provider may ask about your: Medical history, including: Past medical problems. Family medical history. Pregnancy history. Current health, including: Menstrual cycle. Method of birth control. Emotional well-being. Home life and relationship well-being. Sexual activity and sexual health. Lifestyle, including: Alcohol, nicotine or tobacco, and drug use. Access to firearms. Diet, exercise, and sleep habits. Work and work environment. Sunscreen use. Safety issues such as seatbelt and bike helmet use. Physical exam Your health care provider may check your: Height and weight. These may be used to calculate your BMI (body mass index). BMI is a measurement that tells if you are at a healthy weight. Waist circumference. This measures the distance around your waistline. This measurement also tells if you are at a healthy weight and may help predict your risk of certain diseases, such as type 2 diabetes and high blood pressure. Heart rate and blood pressure. Body temperature. Skin for abnormal spots. What immunizations do I need?  Vaccines are usually given at various ages, according to a schedule. Your health care provider will recommend vaccines for you based on your age, medical history, and lifestyle or other factors, such as travel or where you work. What tests do I need? Screening Your health care provider may recommend screening tests for certain conditions. This may include: Pelvic exam and Pap test. Lipid and cholesterol levels. Diabetes screening. This is done by checking your blood sugar (glucose) after you have not eaten for a while (fasting). Hepatitis  B test. Hepatitis C test. HIV (human immunodeficiency virus) test. STI (sexually transmitted infection) testing, if you are at risk. BRCA-related cancer screening. This may be done if you have a family history of breast, ovarian, tubal, or peritoneal cancers. Talk with your health care provider about your test results, treatment options, and if necessary, the need for more tests. Follow these instructions at home: Eating and drinking  Eat a healthy diet that includes fresh fruits and vegetables, whole grains, lean protein, and low-fat dairy products. Take vitamin and mineral supplements as recommended by your health care provider. Do not drink alcohol if: Your health care provider tells you not to drink. You are pregnant, may be pregnant, or are planning to become pregnant. If you drink alcohol: Limit how much you have to 0-1 drink a day. Know how much alcohol is in your drink. In the [place] one drink equals one 12 oz bottle of beer (355 mL), one 5 oz glass of wine (148 mL), or one 1 oz glass of hard liquor (44 mL). Lifestyle Brush your teeth every morning and night with fluoride toothpaste. Floss one time each day. Exercise for at least 30 minutes 5 or more days each week. Do not use any products that contain nicotine or tobacco. These products include cigarettes, chewing tobacco, and vaping devices, such as e-cigarettes. If you need help quitting, ask your health care provider. Do not use drugs. If you are sexually active, practice safe sex. Use a condom or other form of protection to prevent STIs. If you do not wish to become pregnant, use a form of birth control. If you plan to become pregnant, see your health care provider for a prepregnancy visit. Find healthy ways to manage stress, such as: Meditation,   yoga, or listening to music. Journaling. Talking to a trusted person. Spending time with friends and family. Minimize exposure to UV radiation to reduce your risk of skin  cancer. Safety Always wear your seat belt while driving or riding in a vehicle. Do not drive: If you have been drinking alcohol. Do not ride with someone who has been drinking. If you have been using any mind-altering substances or drugs. While texting. When you are tired or distracted. Wear a helmet and other protective equipment during sports activities. If you have firearms in your house, make sure you follow all gun safety procedures. Seek help if you have been physically or sexually abused. What's next? Go to your health care provider once a year for an annual wellness visit. Ask your health care provider how often you should have your eyes and teeth checked. Stay up to date on all vaccines. This information is not intended to replace advice given to you by your health care provider. Make sure you discuss any questions you have with your health care provider. Document Revised: 12/17/2020 Document Reviewed: 12/17/2020 Elsevier Patient Education  2024 Else[employer] Inc. Breast Self-Awareness Breast self-awareness is knowing how your breasts look and feel. You need to: Check your breasts on a regular basis. Tell your doctor about any changes. Become familiar with the look and feel of your breasts. This can help you catch a breast problem while it is still small and can be treated. You should do breast self-exams even if you have breast implants. What you need: A mirror. A well-lit room. A pillow or other soft object. How to do a breast self-exam Follow these steps to do a breast self-exam: Look for changes  Take off all the clothes above your waist. Stand in front of a mirror in a room with good lighting. Put your hands down at your sides. Compare your breasts in the mirror. Look for any difference between them, such as: A difference in shape. A difference in size. Wrinkles, dips, and bumps in one breast and not the other. Look at each breast for changes in the skin, such  as: Redness. Scaly areas. Skin that has gotten thicker. Dimpling. Open sores (ulcers). Look for changes in your nipples, such as: Fluid coming out of a nipple. Fluid around a nipple. Bleeding. Dimpling. Redness. A nipple that looks pushed in (retracted), or that has changed position. Feel for changes Lie on your back. Feel each breast. To do this: Pick a breast to feel. Place a pillow under the shoulder closest to that breast. Put the arm closest to that breast behind your head. Feel the nipple area of that breast using the hand of your other arm. Feel the area with the pads of your three middle fingers by making small circles with your fingers. Use light, medium, and firm pressure. Continue the overlapping circles, moving downward over the breast. Keep making circles with your fingers. Stop when you feel your ribs. Start making circles with your fingers again, this time going upward until you reach your collarbone. Then, make circles outward across your breast and into your armpit area. Squeeze your nipple. Check for discharge and lumps. Repeat these steps to check your other breast. Sit or stand in the tub or shower. With soapy water on your skin, feel each breast the same way you did when you were lying down. Write down what you find Writing down what you find can help you remember what to tell your doctor. Write down: What is   normal for each breast. Any changes you find in each breast. These include: The kind of changes you find. A tender or painful breast. Any lump you find. Write down its size and where it is. When you last had your monthly period (menstrual cycle). General tips If you are breastfeeding, the best time to check your breasts is after you feed your baby or after you use a breast pump. If you get monthly bleeding, the best time to check your breasts is 5-7 days after your monthly cycle ends. With time, you will become comfortable with the self-exam. You will  also start to know if there are changes in your breasts. Contact a doctor if: You see a change in the shape or size of your breasts or nipples. You see a change in the skin of your breast or nipples, such as red or scaly skin. You have fluid coming from your nipples that is not normal. You find a new lump or thick area. You have breast pain. You have any concerns about your breast health. Summary Breast self-awareness includes looking for changes in your breasts and feeling for changes within your breasts. You should do breast self-awareness in front of a mirror in a well-lit room. If you get monthly periods (menstrual cycles), the best time to check your breasts is 5-7 days after your period ends. Tell your doctor about any changes you see in your breasts. Changes include changes in size, changes on the skin, painful or tender breasts, or fluid from your nipples that is not normal. This information is not intended to replace advice given to you by your health care provider. Make sure you discuss any questions you have with your health care provider. Document Revised: 11/26/2021 Document Reviewed: 04/23/2021 Elsevier Patient Education  2024 Elsevier Inc.  

## 2022-12-08 ENCOUNTER — Encounter: Payer: Self-pay | Admitting: Obstetrics and Gynecology

## 2022-12-08 ENCOUNTER — Other Ambulatory Visit (HOSPITAL_COMMUNITY)
Admission: RE | Admit: 2022-12-08 | Discharge: 2022-12-08 | Disposition: A | Discharge status: Home / Self Care | Payer: BC Managed Care – PPO | Source: Ambulatory Visit | Attending: Obstetrics and Gynecology | Admitting: Obstetrics and Gynecology

## 2022-12-08 ENCOUNTER — Ambulatory Visit (INDEPENDENT_AMBULATORY_CARE_PROVIDER_SITE_OTHER): Payer: BC Managed Care – PPO | Admitting: Obstetrics and Gynecology

## 2022-12-08 VITALS — BP 128/73 | HR 81 | Resp 16 | Ht 62.0 in | Wt 149.7 lb

## 2022-12-08 DIAGNOSIS — Z01419 Encounter for gynecological examination (general) (routine) without abnormal findings: Secondary | ICD-10-CM | POA: Diagnosis not present

## 2022-12-08 DIAGNOSIS — Z1159 Encounter for screening for other viral diseases: Secondary | ICD-10-CM

## 2022-12-08 DIAGNOSIS — Z13220 Encounter for screening for lipoid disorders: Secondary | ICD-10-CM

## 2022-12-08 DIAGNOSIS — Z131 Encounter for screening for diabetes mellitus: Secondary | ICD-10-CM

## 2022-12-08 DIAGNOSIS — Z3042 Encounter for surveillance of injectable contraceptive: Secondary | ICD-10-CM

## 2022-12-08 DIAGNOSIS — E663 Overweight: Secondary | ICD-10-CM

## 2022-12-09 LAB — COMPREHENSIVE METABOLIC PANEL
ALT: 12 IU/L (ref 0–32)
AST: 16 IU/L (ref 0–40)
Albumin/Globulin Ratio: 1.7 (ref 1.2–2.2)
Albumin: 4.8 g/dL (ref 3.9–4.9)
Alkaline Phosphatase: 85 IU/L (ref 44–121)
BUN/Creatinine Ratio: 13 (ref 9–23)
BUN: 11 mg/dL (ref 6–20)
Bilirubin Total: 0.5 mg/dL (ref 0.0–1.2)
CO2: 22 mmol/L (ref 20–29)
Calcium: 10 mg/dL (ref 8.7–10.2)
Chloride: 102 mmol/L (ref 96–106)
Creatinine, Ser: 0.84 mg/dL (ref 0.57–1.00)
Globulin, Total: 2.9 g/dL (ref 1.5–4.5)
Glucose: 79 mg/dL (ref 70–99)
Potassium: 4.6 mmol/L (ref 3.5–5.2)
Sodium: 138 mmol/L (ref 134–144)
Total Protein: 7.7 g/dL (ref 6.0–8.5)
eGFR: 95 mL/min/{1.73_m2} (ref >59)

## 2022-12-09 LAB — CBC
Hematocrit: 47.3 % — ABNORMAL HIGH (ref 34.0–46.6)
Hemoglobin: 15.1 g/dL (ref 11.1–15.9)
MCH: 29.2 pg (ref 26.6–33.0)
MCHC: 31.9 g/dL (ref 31.5–35.7)
MCV: 91 fL (ref 79–97)
Platelets: 261 10*3/uL (ref 150–450)
RBC: 5.18 x10E6/uL (ref 3.77–5.28)
RDW: 12.3 % (ref 11.7–15.4)
WBC: 7.8 10*3/uL (ref 3.4–10.8)

## 2022-12-09 LAB — TSH: TSH: 1.2 u[IU]/mL (ref 0.450–4.500)

## 2022-12-09 LAB — HEPATITIS C ANTIBODY: Hep C Virus Ab: NONREACTIVE

## 2022-12-20 ENCOUNTER — Ambulatory Visit (INDEPENDENT_AMBULATORY_CARE_PROVIDER_SITE_OTHER): Payer: BC Managed Care – PPO

## 2022-12-20 VITALS — BP 108/83 | HR 75 | Resp 16 | Ht 63.0 in | Wt 151.8 lb

## 2022-12-20 DIAGNOSIS — Z3042 Encounter for surveillance of injectable contraceptive: Secondary | ICD-10-CM | POA: Diagnosis not present

## 2022-12-20 MED ORDER — MEDROXYPROGESTERONE ACETATE 150 MG/ML IM SUSP
150.0000 mg | Freq: Once | INTRAMUSCULAR | Status: AC
  Administered 2022-12-20: 150 mg via INTRAMUSCULAR

## 2022-12-20 NOTE — Progress Notes (Signed)
    NURSE VISIT NOTE  Subjective:    Patient ID: Emily Dean, female    DOB: 1990/07/31, 32 y.o.   MRN: [id number]  HPI  Patient is a 32 y.o. G74P1011 female who presents for depo provera injection.   Objective:    There were no vitals taken for this visit.  Last Annual: 08/14/2021. Last pap: 10/26/2017. Last Depo-Provera: 09/27/2022. Side Effects if any: none. Serum HCG indicated? No . Depo-Provera 150 mg IM given by: Santiago Bumpers, CMA. Site: Right Upper Outer Quandrant  Lab Review  @THIS  VISIT ONLY@  Assessment:   1. Encounter for surveillance of injectable contraceptive      Plan:   Next appointment due between September 2 and Septemeber 16.    Santiago Bumpers, CMA [facility]

## 2022-12-20 NOTE — Patient Instructions (Signed)

## 2022-12-21 ENCOUNTER — Encounter: Payer: Self-pay | Admitting: Obstetrics and Gynecology

## 2022-12-21 LAB — CYTOLOGY - PAP
Comment: NEGATIVE
Comment: NEGATIVE
Comment: NEGATIVE
Diagnosis: NEGATIVE
HPV 16: NEGATIVE
HPV 18 / 45: NEGATIVE
High risk HPV: POSITIVE — AB

## 2022-12-22 ENCOUNTER — Encounter: Payer: Self-pay | Admitting: Obstetrics and Gynecology

## 2023-03-16 NOTE — Patient Instructions (Signed)

## 2023-03-16 NOTE — Progress Notes (Deleted)
    NURSE VISIT NOTE  Subjective:    Patient ID: Emily Dean, female    DOB: 03-May-1991, 32 y.o.   MRN: [id number]  HPI  Patient is a 31 y.o. G87P1011 female who presents for depo provera injection.   Objective:    There were no vitals taken for this visit.  Last Annual: 08/14/2021. Last pap: 10/26/2017. Last Depo-Provera: 12/20/2022. Side Effects if any: {NONE:21772}***. Serum HCG indicated? {YES/NO:21197}. Depo-Provera 150 mg IM given by: Santiago Bumpers, CMA. Site: {AOB INJ [id number]  Lab Review  @THIS  VISIT ONLY@  Assessment:   1. Encounter for surveillance of injectable contraceptive      Plan:   Next appointment due between November 28 and December 12.    Santiago Bumpers, CMA [facility]

## 2023-03-17 DIAGNOSIS — Z3042 Encounter for surveillance of injectable contraceptive: Secondary | ICD-10-CM

## 2023-03-18 NOTE — Progress Notes (Signed)
This encounter was created in error - please disregard.

## 2023-03-21 ENCOUNTER — Ambulatory Visit (INDEPENDENT_AMBULATORY_CARE_PROVIDER_SITE_OTHER): Payer: BC Managed Care – PPO

## 2023-03-21 VITALS — BP 120/91 | HR 88 | Wt 155.9 lb

## 2023-03-21 DIAGNOSIS — Z3202 Encounter for pregnancy test, result negative: Secondary | ICD-10-CM | POA: Diagnosis not present

## 2023-03-21 DIAGNOSIS — Z3042 Encounter for surveillance of injectable contraceptive: Secondary | ICD-10-CM

## 2023-03-21 LAB — POCT URINE PREGNANCY: Preg Test, Ur: NEGATIVE

## 2023-03-21 MED ORDER — MEDROXYPROGESTERONE ACETATE 150 MG/ML IM SUSY
150.0000 mg | PREFILLED_SYRINGE | Freq: Once | INTRAMUSCULAR | Status: AC
  Administered 2023-03-21: 150 mg via INTRAMUSCULAR

## 2023-03-21 NOTE — Progress Notes (Addendum)
NURSE VISIT NOTE  Subjective:    Patient ID: JESSLIN HERRAN, female    DOB: 1990-10-16, 33 y.o.   MRN: [id number]  HPI  Patient is a 32 y.o. G53P1011 female who presents for depo provera injection.   Objective:    BP (!) 120/91   Pulse 88   Wt 155 lb 14.4 oz (70.7 kg)   BMI 27.62 kg/m   Last Annual: 08/14/2021. Last pap: 10/26/2017. Last Depo-Provera: 09/27/2022. Side Effects if any: none. Serum HCG indicated? No . Depo-Provera 150 mg IM given by: Doristine Devoid, CMA. Site: Right Upper Outer Quandrant  Lab Review  @THIS  VISIT ONLY@  Assessment:   1. Encounter for surveillance of injectable contraceptive      Plan:   Next appointment due between December 2 thru December 16.    Burtis Junes, CMA

## 2023-04-01 ENCOUNTER — Encounter: Payer: Self-pay | Admitting: Obstetrics and Gynecology

## 2023-06-16 ENCOUNTER — Ambulatory Visit: Payer: BC Managed Care – PPO

## 2023-06-16 VITALS — BP 128/89 | HR 98 | Wt 159.8 lb

## 2023-06-16 DIAGNOSIS — Z3042 Encounter for surveillance of injectable contraceptive: Secondary | ICD-10-CM | POA: Diagnosis not present

## 2023-06-16 MED ORDER — MEDROXYPROGESTERONE ACETATE 150 MG/ML IM SUSY
150.0000 mg | PREFILLED_SYRINGE | Freq: Once | INTRAMUSCULAR | Status: AC
  Administered 2023-06-16: 150 mg via INTRAMUSCULAR

## 2023-06-16 NOTE — Progress Notes (Signed)
    NURSE VISIT NOTE  Subjective:    Patient ID: MERICA MURDEN, female    DOB: [birth year], 32 y.o.   MRN: [id number]  HPI  Patient is a 32 y.o. G77P1011 female who presents for depo provera injection.   Objective:    BP 128/89 (Cuff Size: Normal)   Pulse 98   Wt 159 lb 12.8 oz (72.5 kg)   BMI 28.31 kg/m   Last Annual: 12/08/2022. Last pap: 12/08/2022. Last Depo-Provera: 03/21/2023. Side Effects if any: none. Serum HCG indicated? No . Depo-Provera 150 mg IM given by: Doristine Devoid, CMA. Site: Left Upper Outer Quandrant    Assessment:   1. Surveillance for Depo-Provera contraception      Plan:   Next appointment due between Feb27th thru March 13th. Per patient this will be her last injection as her ans her husband plan to have another child.     Burtis Junes, CMA

## 2023-09-09 ENCOUNTER — Encounter: Payer: Self-pay | Admitting: Obstetrics and Gynecology

## 2023-09-12 DIAGNOSIS — R350 Frequency of micturition: Secondary | ICD-10-CM | POA: Diagnosis not present

## 2023-09-12 DIAGNOSIS — A499 Bacterial infection, unspecified: Secondary | ICD-10-CM | POA: Diagnosis not present

## 2023-09-12 DIAGNOSIS — N390 Urinary tract infection, site not specified: Secondary | ICD-10-CM | POA: Diagnosis not present

## 2023-09-12 DIAGNOSIS — B3731 Acute candidiasis of vulva and vagina: Secondary | ICD-10-CM | POA: Diagnosis not present

## 2023-12-12 ENCOUNTER — Ambulatory Visit (INDEPENDENT_AMBULATORY_CARE_PROVIDER_SITE_OTHER): Admitting: Certified Nurse Midwife

## 2023-12-12 ENCOUNTER — Encounter: Payer: Self-pay | Admitting: Certified Nurse Midwife

## 2023-12-12 ENCOUNTER — Other Ambulatory Visit (HOSPITAL_COMMUNITY)
Admission: RE | Admit: 2023-12-12 | Discharge: 2023-12-12 | Disposition: A | Discharge status: Home / Self Care | Source: Ambulatory Visit | Attending: Certified Nurse Midwife | Admitting: Certified Nurse Midwife

## 2023-12-12 VITALS — BP 123/96 | HR 80 | Ht 63.0 in | Wt 158.4 lb

## 2023-12-12 DIAGNOSIS — Z01419 Encounter for gynecological examination (general) (routine) without abnormal findings: Secondary | ICD-10-CM | POA: Diagnosis not present

## 2023-12-12 DIAGNOSIS — Z131 Encounter for screening for diabetes mellitus: Secondary | ICD-10-CM

## 2023-12-12 DIAGNOSIS — Z1151 Encounter for screening for human papillomavirus (HPV): Secondary | ICD-10-CM | POA: Insufficient documentation

## 2023-12-12 DIAGNOSIS — Z1329 Encounter for screening for other suspected endocrine disorder: Secondary | ICD-10-CM

## 2023-12-12 DIAGNOSIS — Z113 Encounter for screening for infections with a predominantly sexual mode of transmission: Secondary | ICD-10-CM

## 2023-12-12 DIAGNOSIS — Z13220 Encounter for screening for lipoid disorders: Secondary | ICD-10-CM | POA: Diagnosis not present

## 2023-12-12 DIAGNOSIS — Z124 Encounter for screening for malignant neoplasm of cervix: Secondary | ICD-10-CM

## 2023-12-12 NOTE — Progress Notes (Signed)
 ANNUAL EXAM Patient name: Emily Dean MRN [id number]  Date of birth: 1991/07/03 Chief Complaint:   Annual Exam  History of Present Illness:   Emily Dean is a 33 y.o. G33P1011 female being seen today for a routine annual exam.  Current complaints: last Depo injection 05/2023, wondering when cycle will return, open to pregnancy. Employed at [employer] Recently moved   No LMP recorded (within months).   Upstream - 12/12/23 0835       Pregnancy Intention Screening   Does the patient want to become pregnant in the next year? Yes    Does the patient's partner want to become pregnant in the next year? Yes    Would the patient like to discuss contraceptive options today? No      Contraception Wrap Up   Current Method No Contraceptive Precautions    End Method No Contraception Precautions    Contraception Counseling Provided No    How was the end contraceptive method provided? N/A            The pregnancy intention screening data noted above was reviewed. Potential methods of contraception were discussed. The patient elected to proceed with No Contraception Precautions.      Component Value Date/Time   DIAGPAP  12/08/2022 0956    - Negative for intraepithelial lesion or malignancy (NILM)   HPVHIGH Positive (A) 12/08/2022 0956   ADEQPAP  12/08/2022 0956    Satisfactory for evaluation; transformation zone component PRESENT.       Last pap 12/2022. Results were: NILM w/ HRHPV positive: other (not 16, 18/45). H/O abnormal pap: no Last mammogram: n/a. Results were: N/A. Family h/o breast cancer: no Last colonoscopy: n/a. Results were: N/A. Family h/o colorectal cancer: no     12/12/2023    9:32 AM 12/08/2022    9:19 AM 08/14/2021    8:45 AM  Depression screen PHQ 2/9  Decreased Interest 0 0 1  Down, Depressed, Hopeless 1 0 1  PHQ - 2 Score 1 0 2  Altered sleeping   0  Tired, decreased energy   2  Change in appetite   0  Feeling bad or failure about yourself    0   Trouble concentrating   0  Moving slowly or fidgety/restless   0  Suicidal thoughts   0  PHQ-9 Score   4        08/14/2021    8:45 AM  GAD 7 : Generalized Anxiety Score  Nervous, Anxious, on Edge 0  Control/stop worrying 0  Worry too much - different things 0  Trouble relaxing 0  Restless 0  Easily annoyed or irritable 2  Afraid - awful might happen 0  Total GAD 7 Score 2     Review of Systems:   Pertinent items are noted in HPI Denies any headaches, blurred vision, fatigue, shortness of breath, chest pain, abdominal pain, abnormal vaginal discharge/itching/odor/irritation, problems with periods, bowel movements, urination, or intercourse unless otherwise stated above. Pertinent History Reviewed:  Reviewed past medical,surgical, social and family history.  Reviewed problem list, medications and allergies. Physical Assessment:   Vitals:   12/12/23 0832  BP: (!) 123/96  Pulse: 80  Weight: 158 lb 6.4 oz (71.8 kg)  Height: 5\' 3"  (1.6 m)  Body mass index is 28.06 kg/m.       Physical Exam Vitals reviewed.  Constitutional:      General: She is not in acute distress.    Appearance: Normal appearance.  HENT:  Head: Normocephalic.  Neck:     Thyroid : No thyroid  mass or thyromegaly.  Cardiovascular:     Rate and Rhythm: Normal rate and regular rhythm.     Heart sounds: Normal heart sounds.  Pulmonary:     Effort: Pulmonary effort is normal.     Breath sounds: Normal breath sounds.  Chest:  Breasts:    Tanner Score is 5.     Right: Normal. No inverted nipple, mass or tenderness.     Left: Inverted nipple present. No mass or tenderness.  Abdominal:     General: Abdomen is flat.     Palpations: Abdomen is soft.     Tenderness: There is no abdominal tenderness.  Genitourinary:    General: Normal vulva.     Tanner stage (genital): 5.     Vagina: Normal.     Cervix: Normal.     Uterus: Not enlarged, not fixed and not tender.      Adnexa:        Right: No mass  or tenderness.         Left: No mass or tenderness.    Musculoskeletal:     Cervical back: Neck supple. No tenderness.  Lymphadenopathy:     Upper Body:     Right upper body: No axillary adenopathy.     Left upper body: No axillary adenopathy.  Skin:    General: Skin is warm and dry.  Neurological:     General: No focal deficit present.     Mental Status: She is alert and oriented to person, place, and time.  Psychiatric:        Mood and Affect: Mood normal.        Behavior: Behavior normal.      No results found for this or any previous visit (from the past 24 hours).  Assessment & Plan:  1. Well woman exam (Primary) - CBC - Comprehensive metabolic panel with GFR - Hemoglobin A1c - Lipid panel - HIV Antibody (routine testing w rflx) - RPR - TSH Rfx on Abnormal to Free T4 - VITAMIN D 25 Hydroxy (Vit-D Deficiency, Fractures)  2. Cervical cancer screening - Cytology - PAP  3. Encounter for screening for human papillomavirus (HPV) - Cytology - PAP  4. Screening for STD (sexually transmitted disease) - HIV Antibody (routine testing w rflx) - RPR  5. Screening for thyroid  disorder  6. Encounter for screening for diabetes mellitus - Hemoglobin A1c  7. Screening for lipid disorders - Lipid panel  Reviewed normalcy of irregular or no cycle following cessation of depo, last injection November of 2024, would expect cycle to return in next 3-40m but may take up to a year from last cycle. Encouraged PNV/folic acid supplement prior to pregnancy.  Mammogram: @ 33yo, or sooner if problems Colonoscopy: @ 33yo, or sooner if problems  Orders Placed This Encounter  Procedures   CBC   Comprehensive metabolic panel with GFR   Hemoglobin A1c   Lipid panel   HIV Antibody (routine testing w rflx)   RPR   TSH Rfx on Abnormal to Free T4   VITAMIN D 25 Hydroxy (Vit-D Deficiency, Fractures)    Meds: No orders of the defined types were placed in this encounter.   Follow-up: No  follow-ups on file.  Forestine Igo, CNM 12/12/2023 9:36 AM

## 2023-12-12 NOTE — Patient Instructions (Signed)

## 2023-12-13 LAB — LIPID PANEL
Chol/HDL Ratio: 3.7 ratio (ref 0.0–4.4)
Cholesterol, Total: 122 mg/dL (ref 100–199)
HDL: 33 mg/dL — ABNORMAL LOW (ref >39)
LDL Chol Calc (NIH): 74 mg/dL (ref 0–99)
Triglycerides: 70 mg/dL (ref 0–149)
VLDL Cholesterol Cal: 15 mg/dL (ref 5–40)

## 2023-12-13 LAB — COMPREHENSIVE METABOLIC PANEL WITH GFR
ALT: 15 IU/L (ref 0–32)
AST: 17 IU/L (ref 0–40)
Albumin: 4.4 g/dL (ref 3.9–4.9)
Alkaline Phosphatase: 88 IU/L (ref 44–121)
BUN/Creatinine Ratio: 12 (ref 9–23)
BUN: 8 mg/dL (ref 6–20)
Bilirubin Total: 0.4 mg/dL (ref 0.0–1.2)
CO2: 23 mmol/L (ref 20–29)
Calcium: 9.3 mg/dL (ref 8.7–10.2)
Chloride: 102 mmol/L (ref 96–106)
Creatinine, Ser: 0.69 mg/dL (ref 0.57–1.00)
Globulin, Total: 2.7 g/dL (ref 1.5–4.5)
Glucose: 81 mg/dL (ref 70–99)
Potassium: 4.2 mmol/L (ref 3.5–5.2)
Sodium: 139 mmol/L (ref 134–144)
Total Protein: 7.1 g/dL (ref 6.0–8.5)
eGFR: 118 mL/min/{1.73_m2} (ref >59)

## 2023-12-13 LAB — CBC
Hematocrit: 44 % (ref 34.0–46.6)
Hemoglobin: 14.2 g/dL (ref 11.1–15.9)
MCH: 30.1 pg (ref 26.6–33.0)
MCHC: 32.3 g/dL (ref 31.5–35.7)
MCV: 93 fL (ref 79–97)
Platelets: 276 10*3/uL (ref 150–450)
RBC: 4.72 x10E6/uL (ref 3.77–5.28)
RDW: 11.9 % (ref 11.7–15.4)
WBC: 7.7 10*3/uL (ref 3.4–10.8)

## 2023-12-13 LAB — HIV ANTIBODY (ROUTINE TESTING W REFLEX): HIV Screen 4th Generation wRfx: NONREACTIVE

## 2023-12-13 LAB — HEMOGLOBIN A1C
Est. average glucose Bld gHb Est-mCnc: 105 mg/dL
Hgb A1c MFr Bld: 5.3 % (ref 4.8–5.6)

## 2023-12-13 LAB — RPR: RPR Ser Ql: NONREACTIVE

## 2023-12-13 LAB — VITAMIN D 25 HYDROXY (VIT D DEFICIENCY, FRACTURES): Vit D, 25-Hydroxy: 26.3 ng/mL — ABNORMAL LOW (ref 30.0–100.0)

## 2023-12-13 LAB — TSH RFX ON ABNORMAL TO FREE T4: TSH: 1.76 u[IU]/mL (ref 0.450–4.500)

## 2023-12-14 ENCOUNTER — Encounter: Payer: Self-pay | Admitting: Certified Nurse Midwife

## 2023-12-14 LAB — CYTOLOGY - PAP
Chlamydia: POSITIVE — AB
Comment: NEGATIVE
Comment: NEGATIVE
Comment: NORMAL
Diagnosis: UNDETERMINED — AB
High risk HPV: POSITIVE — AB
Neisseria Gonorrhea: NEGATIVE

## 2023-12-15 ENCOUNTER — Ambulatory Visit

## 2023-12-15 ENCOUNTER — Other Ambulatory Visit (HOSPITAL_COMMUNITY)
Admission: RE | Admit: 2023-12-15 | Discharge: 2023-12-15 | Disposition: A | Discharge status: Home / Self Care | Source: Ambulatory Visit | Attending: Certified Nurse Midwife | Admitting: Certified Nurse Midwife

## 2023-12-15 ENCOUNTER — Ambulatory Visit: Payer: Self-pay | Admitting: Certified Nurse Midwife

## 2023-12-15 ENCOUNTER — Encounter: Payer: Self-pay | Admitting: Certified Nurse Midwife

## 2023-12-15 ENCOUNTER — Other Ambulatory Visit: Payer: Self-pay | Admitting: Certified Nurse Midwife

## 2023-12-15 VITALS — BP 127/83 | HR 94 | Ht 63.0 in | Wt 157.3 lb

## 2023-12-15 DIAGNOSIS — Z113 Encounter for screening for infections with a predominantly sexual mode of transmission: Secondary | ICD-10-CM

## 2023-12-15 DIAGNOSIS — R87610 Atypical squamous cells of undetermined significance on cytologic smear of cervix (ASC-US): Secondary | ICD-10-CM | POA: Insufficient documentation

## 2023-12-15 MED ORDER — DOXYCYCLINE HYCLATE 100 MG PO CAPS
100.0000 mg | ORAL_CAPSULE | Freq: Two times a day (BID) | ORAL | 0 refills | Status: DC
Start: 2023-12-15 — End: 2023-12-16

## 2023-12-15 NOTE — Progress Notes (Signed)
 TC to [facility], pap results with +chlamydia reviewed. Discussed that we cannot determine timing of infection and that a possibility of false positive does exist. Offered nurse visit for self-collected Aptima swab for verification & this was accepted. Doxycycline 100mg  po bid prescribed.

## 2023-12-15 NOTE — Progress Notes (Addendum)
    NURSE VISIT NOTE  Subjective:    Patient ID: Emily Dean, female    DOB: 1991/05/01, 33 y.o.   MRN: [id number]  HPI  Patient is a 33 y.o. G26P1011 female who presents for confirmation of recent STI on pap. Denies abnormal vaginal bleeding or significant pelvic pain or fever. denies genital rash, genital irritation, and vaginal discharge. Patient denies history of known exposure to STD.   Objective:    BP 127/83   Pulse 94   Ht 5' 3 (1.6 m)   Wt 157 lb 4.8 oz (71.4 kg)   LMP  (LMP Unknown) Comment: no cycle following cessation of depo, last injection November of 2024  BMI 27.86 kg/m      Assessment:   1. Screen for sexually transmitted diseases     Chlamydia  Plan:   GC and chlamydia DNA  probe sent to lab. Treatment: abstain from coitus during course of treatment and Doxycycline 100mg  capsule 2 times a day for 7 days. ROV prn if symptoms persist or worsen.   Aldona Amel, CMA

## 2023-12-16 ENCOUNTER — Other Ambulatory Visit: Payer: Self-pay

## 2023-12-16 DIAGNOSIS — A749 Chlamydial infection, unspecified: Secondary | ICD-10-CM

## 2023-12-16 LAB — CERVICOVAGINAL ANCILLARY ONLY
Chlamydia: POSITIVE — AB
Comment: NEGATIVE
Comment: NORMAL
Neisseria Gonorrhea: NEGATIVE

## 2023-12-16 MED ORDER — DOXYCYCLINE HYCLATE 100 MG PO CAPS: 100.0000 mg | ORAL_CAPSULE | Freq: Two times a day (BID) | ORAL | 0 refills | Status: AC

## 2023-12-16 NOTE — Progress Notes (Signed)
 Rx for partner Chlamydia treatment sent to pharmacy.  NKDA.

## 2023-12-17 ENCOUNTER — Ambulatory Visit: Payer: Self-pay | Admitting: Certified Nurse Midwife

## 2023-12-20 ENCOUNTER — Encounter: Payer: Self-pay | Admitting: Certified Nurse Midwife

## 2023-12-20 MED ORDER — FLUCONAZOLE 150 MG PO TABS: 150.0000 mg | ORAL_TABLET | ORAL | 0 refills | Status: AC

## 2023-12-23 ENCOUNTER — Other Ambulatory Visit: Payer: Self-pay | Admitting: Certified Nurse Midwife

## 2023-12-23 ENCOUNTER — Encounter: Payer: Self-pay | Admitting: Certified Nurse Midwife

## 2023-12-23 MED ORDER — TERCONAZOLE 0.4 % VA CREA: 1.0000 | TOPICAL_CREAM | Freq: Every day | VAGINAL | 0 refills | Status: DC

## 2023-12-23 MED ORDER — FLUCONAZOLE 150 MG PO TABS: 150.0000 mg | ORAL_TABLET | ORAL | 0 refills | Status: AC

## 2024-01-16 ENCOUNTER — Ambulatory Visit

## 2024-01-16 ENCOUNTER — Other Ambulatory Visit (HOSPITAL_COMMUNITY)
Admission: RE | Admit: 2024-01-16 | Discharge: 2024-01-16 | Disposition: A | Discharge status: Home / Self Care | Source: Ambulatory Visit | Attending: Certified Nurse Midwife | Admitting: Certified Nurse Midwife

## 2024-01-16 VITALS — BP 123/89 | HR 88 | Ht 63.0 in | Wt 161.2 lb

## 2024-01-16 DIAGNOSIS — Z113 Encounter for screening for infections with a predominantly sexual mode of transmission: Secondary | ICD-10-CM | POA: Diagnosis not present

## 2024-01-16 NOTE — Progress Notes (Signed)
    NURSE VISIT NOTE  Subjective:    Patient ID: Emily Dean, female    DOB: [birth year], 33 y.o.   MRN: [id number]  HPI  Patient is a 33 y.o. G58P1011 female who presents for [facility] STD screen. Reports vaginal burning but denies other symptoms.    Objective:    BP 123/89   Pulse 88   Ht 5' 3 (1.6 m)   Wt 161 lb 3.2 oz (73.1 kg)   BMI 28.56 kg/m      Assessment:   1. Screening for STD (sexually transmitted disease)       Plan:   GC and chlamydia DNA  probe sent to lab. Treatment: await results for further treatment ROV prn if symptoms persist or worsen.   [name], CMA

## 2024-01-17 ENCOUNTER — Encounter: Payer: Self-pay | Admitting: Certified Nurse Midwife

## 2024-01-17 ENCOUNTER — Other Ambulatory Visit: Payer: Self-pay | Admitting: Certified Nurse Midwife

## 2024-01-17 LAB — CERVICOVAGINAL ANCILLARY ONLY
Bacterial Vaginitis (gardnerella): NEGATIVE
Candida Glabrata: NEGATIVE
Candida Vaginitis: POSITIVE — AB
Chlamydia: NEGATIVE
Comment: NEGATIVE
Comment: NEGATIVE
Comment: NEGATIVE
Comment: NEGATIVE
Comment: NEGATIVE
Comment: NORMAL
Neisseria Gonorrhea: NEGATIVE
Trichomonas: NEGATIVE

## 2024-01-17 MED ORDER — FLUCONAZOLE 150 MG PO TABS: 150.0000 mg | ORAL_TABLET | Freq: Once | ORAL | 0 refills | Status: AC

## 2024-01-30 ENCOUNTER — Ambulatory Visit: Admitting: Obstetrics & Gynecology

## 2024-01-30 ENCOUNTER — Other Ambulatory Visit (HOSPITAL_COMMUNITY)
Admission: RE | Admit: 2024-01-30 | Discharge: 2024-01-30 | Disposition: A | Discharge status: Home / Self Care | Source: Ambulatory Visit | Attending: Obstetrics & Gynecology | Admitting: Obstetrics & Gynecology

## 2024-01-30 VITALS — BP 130/87 | HR 81 | Ht 63.0 in | Wt 160.1 lb

## 2024-01-30 DIAGNOSIS — Z01812 Encounter for preprocedural laboratory examination: Secondary | ICD-10-CM | POA: Diagnosis not present

## 2024-01-30 DIAGNOSIS — R87810 Cervical high risk human papillomavirus (HPV) DNA test positive: Secondary | ICD-10-CM | POA: Insufficient documentation

## 2024-01-30 DIAGNOSIS — R87610 Atypical squamous cells of undetermined significance on cytologic smear of cervix (ASC-US): Secondary | ICD-10-CM

## 2024-01-30 DIAGNOSIS — N871 Moderate cervical dysplasia: Secondary | ICD-10-CM | POA: Diagnosis not present

## 2024-01-30 DIAGNOSIS — Z3202 Encounter for pregnancy test, result negative: Secondary | ICD-10-CM

## 2024-01-30 LAB — POCT URINE PREGNANCY: Preg Test, Ur: NEGATIVE

## 2024-01-30 NOTE — Progress Notes (Signed)
    GYNECOLOGY PROGRESS NOTE  Subjective:    Patient ID: PARNIKA TWETEN, female    DOB: August 12, 1990, 33 y.o.   MRN: [id number]  HPI  Patient is a 33 y.o. G77P1011 (33 yo child) here for a colpo due to ASCUS + HR HPV pap 12/2023. She was treated for CT recently with a negative TOC earlier this month. She is not using contraception, would be happy with a pregnancy.  The following portions of the patient's history were reviewed and updated as appropriate: allergies, current medications, past family history, past medical history, past social history, past surgical history, and problem list.  Review of Systems Pertinent items are noted in HPI.   Objective:   Blood pressure 130/87, pulse 81, height 5' 3 (1.6 m), weight 160 lb 1.6 oz (72.6 kg). Body mass index is 28.36 kg/m. General appearance: alert UPT negative, consent signed, time out done Speculum placed. Cervix prepped with acetic acid. Transformation zone seen in its entirety. Colpo adequate. Colposcopic findings show 1 small area (5mm) at the 12 o'clock position with Acetowhite changes and very subtle punctation. I biopsied this area and used silver nitrate to achieve hemostasis. ECC obtained. She tolerated the procedure well.   Assessment:   1. ASCUS with positive high risk HPV cervical   2. Pre-procedure lab exam      Plan:   1. Pre-procedure lab exam  - POCT urine pregnancy  2. ASCUS with positive high risk HPV cervical (Primary)  - POCT urine pregnancy  - await pathology report

## 2024-02-01 LAB — SURGICAL PATHOLOGY

## 2024-02-03 ENCOUNTER — Encounter: Payer: Self-pay | Admitting: Certified Nurse Midwife

## 2024-02-06 ENCOUNTER — Encounter: Payer: Self-pay | Admitting: Obstetrics & Gynecology

## 2024-02-13 ENCOUNTER — Other Ambulatory Visit: Payer: Self-pay | Admitting: Obstetrics & Gynecology

## 2024-02-13 MED ORDER — ALPRAZOLAM 1 MG PO TABS
ORAL_TABLET | ORAL | 0 refills | Status: AC
Start: 2024-02-13 — End: ?

## 2024-02-13 NOTE — Progress Notes (Signed)
 Xanax  1 mg prescribed per patient request for LEEP procedure.

## 2024-02-21 ENCOUNTER — Ambulatory Visit: Admitting: Obstetrics & Gynecology

## 2024-02-21 ENCOUNTER — Other Ambulatory Visit (HOSPITAL_COMMUNITY)
Admission: RE | Admit: 2024-02-21 | Discharge: 2024-02-21 | Disposition: A | Discharge status: Home / Self Care | Source: Ambulatory Visit | Attending: Obstetrics & Gynecology | Admitting: Obstetrics & Gynecology

## 2024-02-21 ENCOUNTER — Encounter: Payer: Self-pay | Admitting: Obstetrics & Gynecology

## 2024-02-21 VITALS — BP 128/88 | HR 106 | Ht 63.0 in | Wt 162.0 lb

## 2024-02-21 DIAGNOSIS — Z3202 Encounter for pregnancy test, result negative: Secondary | ICD-10-CM

## 2024-02-21 DIAGNOSIS — N871 Moderate cervical dysplasia: Secondary | ICD-10-CM | POA: Diagnosis not present

## 2024-02-21 DIAGNOSIS — N870 Mild cervical dysplasia: Secondary | ICD-10-CM | POA: Diagnosis not present

## 2024-02-21 LAB — POCT URINE PREGNANCY: Preg Test, Ur: NEGATIVE

## 2024-02-21 NOTE — Progress Notes (Signed)
    GYNECOLOGY PROGRESS NOTE  Subjective:    Patient ID: Emily Dean, female    DOB: Oct 07, 1990, 33 y.o.   MRN: [id number]  HPI  Patient is a 33 y.o. P18 (78 yo son) here for a LEEP. She had a ASCUS + HR HPV pap and then a colpo that showed some punctation. 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 She has not had sex since her colpo.   The following portions of the patient's history were reviewed and updated as appropriate: allergies, current medications, past family history, past medical history, past social history, past surgical history, and problem list.  Review of Systems Pertinent items are noted in HPI.   Objective:   Blood pressure 128/88, pulse (!) 106, height 5' 3 (1.6 m), weight 162 lb (73.5 kg). Body mass index is 28.7 kg/m. Well nourished, well hydrated female, no apparent distress She is ambulating and conversing normally.   Colpo Biopsy:   Risks, benefits, alternatives, and limitations of procedure explained to patient, including pain, bleeding, infection, failure to remove abnormal tissue and failure to cure dysplasia, need for repeat procedures, damage to pelvic organs, cervical incompetence.  Role of HPV,cervical dysplasia and need for close followup was empasized. Informed written consent was obtained. All questions were answered. Time out performed. Urine pregnancy test was negative.  ??Procedure: The patient was placed in lithotomy position and the bivalved coated speculum was placed in the patient's vagina. A grounding pad placed on the patient. Acetic acid was applied to the cervix and all appeared normal. I sprayed Hurricaine spray.  Local anesthesia was administered via an intracervical block using 15cc of 2% Lidocaine  with epinephrine. The suction was turned on and the medium semicircular LEEP tip on 50 Watts of cutting current and cautery was used to excise the entire transformation zone and any areas of visible dysplasia. I then obtained a top  hat specimen.  I obtained an ECC.  Excellent hemostasis was achieved using roller ball coagulation set at 50 Watts coagulation current. As per my usual, I coated the cone bed with Monsel's. The speculum was removed from the vagina. Specimens were sent to pathology.  ?The patient tolerated the procedure well. Post-operative instructions given to patient, including instruction to seek medical attention for persistent bright red bleeding, fever, abdominal/pelvic pain, dysuria, nausea or vomiting. She was also told about the possibility of having copious yellow to black tinged discharge for weeks. She was counseled to avoid anything in the vagina (sex/douching/tampons) for 3 weeks.     Assessment:   1. Negative pregnancy test      CIN 2 with + ECC  Plan:   1. Negative pregnancy test (Primary)  - POCT urine pregnancy   Await pathology report We discussed starting Gardasil but I told her that this is not approved for use during pregnancy so she will need to decide if she wants to get gardasil and delay pregnancy or not.

## 2024-02-24 LAB — SURGICAL PATHOLOGY

## 2024-02-27 ENCOUNTER — Encounter: Payer: Self-pay | Admitting: Obstetrics & Gynecology

## 2024-03-07 ENCOUNTER — Ambulatory Visit: Admitting: Obstetrics & Gynecology

## 2024-03-17 ENCOUNTER — Encounter: Payer: Self-pay | Admitting: Emergency Medicine

## 2024-03-17 ENCOUNTER — Ambulatory Visit
Admission: EM | Admit: 2024-03-17 | Discharge: 2024-03-17 | Disposition: A | Discharge status: Home / Self Care | Attending: Physician Assistant | Admitting: Physician Assistant

## 2024-03-17 ENCOUNTER — Ambulatory Visit (INDEPENDENT_AMBULATORY_CARE_PROVIDER_SITE_OTHER)

## 2024-03-17 DIAGNOSIS — R059 Cough, unspecified: Secondary | ICD-10-CM | POA: Diagnosis not present

## 2024-03-17 DIAGNOSIS — R052 Subacute cough: Secondary | ICD-10-CM | POA: Diagnosis not present

## 2024-03-17 DIAGNOSIS — J9801 Acute bronchospasm: Secondary | ICD-10-CM | POA: Diagnosis not present

## 2024-03-17 DIAGNOSIS — R0989 Other specified symptoms and signs involving the circulatory and respiratory systems: Secondary | ICD-10-CM | POA: Diagnosis not present

## 2024-03-17 LAB — PREGNANCY, URINE: Preg Test, Ur: NEGATIVE

## 2024-03-17 MED ORDER — PROMETHAZINE-DM 6.25-15 MG/5ML PO SYRP: 5.0000 mL | ORAL_SOLUTION | Freq: Four times a day (QID) | ORAL | 0 refills | Status: AC | PRN

## 2024-03-17 MED ORDER — ALBUTEROL SULFATE HFA 108 (90 BASE) MCG/ACT IN AERS: 1.0000 | INHALATION_SPRAY | Freq: Four times a day (QID) | RESPIRATORY_TRACT | 0 refills | Status: AC | PRN

## 2024-03-17 MED ORDER — PREDNISONE 20 MG PO TABS: 40.0000 mg | ORAL_TABLET | Freq: Every day | ORAL | 0 refills | Status: AC

## 2024-03-17 NOTE — ED Triage Notes (Signed)
 Patient c/o cough and chest congestion for over a month.  Patient denies recent fevers.

## 2024-03-17 NOTE — ED Provider Notes (Signed)
 [facility]    CSN: [id number] Arrival date & time: 03/17/24  0827      History   Chief Complaint Chief Complaint  Patient presents with   Cough    HPI RAMONICA GRIGG is a 33 y.o. female presenting for 1.70-month history of dry cough.  She says symptoms started out like a cold but she no longer has any congestion.  Denies runny nose, sore throat, sinus pressure, ear pain, headaches, body aches, chills, fever.  Sometimes her chest hurts when she coughs but she denies feeling short of breath.  Reports getting into coughing fits where she feels like she cannot take a deep breath.  Symptoms have not gotten any better or worse from onset.  Patient says she does not feel that bad and denies any significant fatigue.  No history of asthma.  Has tried over-the-counter cough medications without relief.  HPI  Past Medical History:  Diagnosis Date   Migraine     Patient Active Problem List   Diagnosis Date Noted   ASCUS with positive high risk HPV cervical 12/15/2023   Overweight (BMI 25.0-29.9) 10/26/2017   Migraine without status migrainosus, not intractable 07/26/2016    Past Surgical History:  Procedure Laterality Date   NO PAST SURGERIES      OB History     Gravida  2   Para  1   Term  1   Preterm      AB  1   Living  1      SAB      IAB  1   Ectopic      Multiple  0   Live Births  1            Home Medications    Prior to Admission medications   Medication Sig Start Date End Date Taking? Authorizing Provider  albuterol  (VENTOLIN  HFA) 108 (90 Base) MCG/ACT inhaler Inhale 1-2 puffs into the lungs every 6 (six) hours as needed for wheezing or shortness of breath. 03/17/24  Yes Arvis Huxley B, PA-C  ALPRAZolam  (XANAX ) 1 MG tablet Take upon arrival to the office after you have signed your consent form. You cannot drive home so have someone available to drive you home. 02/13/24   Dove, Myra C, MD  predniSONE  (DELTASONE ) 20 MG tablet Take 2  tablets (40 mg total) by mouth daily for 5 days. 03/17/24 03/22/24 Yes Arvis Huxley NOVAK, PA-C  promethazine -dextromethorphan (PROMETHAZINE -DM) 6.25-15 MG/5ML syrup Take 5 mLs by mouth 4 (four) times daily as needed. 03/17/24  Yes Arvis Huxley B, PA-C  fluconazole  (DIFLUCAN ) 150 MG tablet Take 150 mg by mouth once. 01/17/24   [provider]    Family History Family History  Problem Relation Age of Onset   Rheum arthritis Mother    Diabetes Father    Thyroid  disease Father    Breast cancer Neg Hx    Ovarian cancer Neg Hx    Colon cancer Neg Hx     Social History Social History   Tobacco Use   Smoking status: Never   Smokeless tobacco: Never  Vaping Use   Vaping status: Never Used  Substance Use Topics   Alcohol use: No   Drug use: No     Allergies   Patient has no known allergies.   Review of Systems Review of Systems  Constitutional:  Negative for chills, diaphoresis, fatigue and fever.  HENT:  Negative for congestion, ear pain, rhinorrhea, sinus pressure, sinus pain and sore  throat.   Respiratory:  Positive for cough. Negative for shortness of breath.   Cardiovascular:  Positive for chest pain.  Gastrointestinal:  Negative for abdominal pain, nausea and vomiting.  Musculoskeletal:  Negative for arthralgias and myalgias.  Skin:  Negative for rash.  Neurological:  Negative for weakness and headaches.  Hematological:  Negative for adenopathy.     Physical Exam Triage Vital Signs ED Triage Vitals  Encounter Vitals Group     BP 03/17/24 0841 (!) 138/98     Girls Systolic BP Percentile --      Girls Diastolic BP Percentile --      Boys Systolic BP Percentile --      Boys Diastolic BP Percentile --      Pulse Rate 03/17/24 0841 89     Resp 03/17/24 0841 14     Temp 03/17/24 0841 99.2 F (37.3 C)     Temp Source 03/17/24 0841 Oral     SpO2 03/17/24 0841 99 %     Weight 03/17/24 0840 162 lb 0.6 oz (73.5 kg)     Height 03/17/24 0840 5' 3 (1.6 m)     Head  Circumference --      Peak Flow --      Pain Score 03/17/24 0840 5     Pain Loc --      Pain Education --      Exclude from Growth Chart --    No data found.  Updated Vital Signs BP (!) 138/98 (BP Location: Right Arm)   Pulse 89   Temp 99.2 F (37.3 C) (Oral)   Resp 14   Ht 5' 3 (1.6 m)   Wt 162 lb 0.6 oz (73.5 kg)   LMP  (LMP Unknown)   SpO2 99%   BMI 28.70 kg/m    Physical Exam Vitals and nursing note reviewed.  Constitutional:      General: She is not in acute distress.    Appearance: Normal appearance. She is not ill-appearing or toxic-appearing.     Comments: Patient coughs frequently  HENT:     Head: Normocephalic and atraumatic.     Nose: Nose normal.     Mouth/Throat:     Mouth: Mucous membranes are moist.     Pharynx: Oropharynx is clear.  Eyes:     General: No scleral icterus.       Right eye: No discharge.        Left eye: No discharge.     Conjunctiva/sclera: Conjunctivae normal.  Cardiovascular:     Rate and Rhythm: Normal rate and regular rhythm.     Heart sounds: Normal heart sounds.  Pulmonary:     Effort: Pulmonary effort is normal. No respiratory distress.     Breath sounds: Normal breath sounds.  Musculoskeletal:     Cervical back: Neck supple.  Skin:    General: Skin is dry.  Neurological:     General: No focal deficit present.     Mental Status: She is alert. Mental status is at baseline.     Motor: No weakness.     Gait: Gait normal.  Psychiatric:        Mood and Affect: Mood normal.        Behavior: Behavior normal.      UC Treatments / Results  Labs (all labs ordered are listed, but only abnormal results are displayed) Labs Reviewed  PREGNANCY, URINE    EKG   Radiology DG Chest 2 View Result Date: 03/17/2024 EXAM:  2 VIEW(S) XRAY OF THE CHEST 03/17/2024 09:29:00 AM COMPARISON: 10/26/07. CLINICAL HISTORY: Cough x 6 weeks. Patient c/o cough and chest congestion for over a month. Patient denies recent fevers. FINDINGS:  LUNGS AND PLEURA: No focal pulmonary opacity. No pulmonary edema. No pleural effusion. No pneumothorax. HEART AND MEDIASTINUM: No acute abnormality of the cardiac and mediastinal silhouettes. BONES AND SOFT TISSUES: No acute osseous abnormality. IMPRESSION: 1. No acute process. Electronically signed by: Waddell Calk MD 03/17/2024 09:55 AM EDT RP Workstation: [id number]    Procedures Procedures (including critical care time)  Medications Ordered in UC Medications - No data to display  Initial Impression / Assessment and Plan / UC Course  I have reviewed the triage vital signs and the nursing notes.  Pertinent labs & imaging results that were available during my care of the patient were reviewed by me and considered in my medical decision making (see chart for details).   33 year old female presents for 6-week history of dry cough, occasional chest pain with coughing and coughing fits.  Denies fever, upper respiratory symptoms or shortness of breath.  Vitals are stable.  She is afebrile.  Overall well-appearing.  Coughs frequently.  No nasal congestion.  Throat clear.  Chest clear.  Heart regular rate and rhythm.  Urine pregnancy test obtained before obtaining chest x-ray. Negative.   Viral illness and acute bronchospasm.  Treating at this time with prednisone , Promethazine  DM and ProAir  inhaler.  Reviewed return precautions and discussed following up PCP.   Final Clinical Impressions(s) / UC Diagnoses   Final diagnoses:  Subacute cough  Acute bronchospasm     Discharge Instructions      - Your chest x-ray was normal. - I sent cough medication, an inhaler and corticosteroid to the pharmacy to treat bronchospasm. - Return if you develop fever, cough becomes productive or breathing difficulty.  If dry cough continues and does not improve after medication please follow-up with PCP.     ED Prescriptions     Medication Sig Dispense Auth. Provider   promethazine -dextromethorphan  (PROMETHAZINE -DM) 6.25-15 MG/5ML syrup Take 5 mLs by mouth 4 (four) times daily as needed. 118 mL Arvis Huxley B, PA-C   predniSONE  (DELTASONE ) 20 MG tablet Take 2 tablets (40 mg total) by mouth daily for 5 days. 10 tablet Arvis Huxley B, PA-C   albuterol  (VENTOLIN  HFA) 108 (90 Base) MCG/ACT inhaler Inhale 1-2 puffs into the lungs every 6 (six) hours as needed for wheezing or shortness of breath. 1 each Arvis Huxley KATHEE DEVONNA      PDMP not reviewed this encounter.   Arvis Huxley KATHEE, PA-C 03/17/24 1002

## 2024-03-17 NOTE — Discharge Instructions (Signed)
-   Your chest x-ray was normal. - I sent cough medication, an inhaler and corticosteroid to the pharmacy to treat bronchospasm. - Return if you develop fever, cough becomes productive or breathing difficulty.  If dry cough continues and does not improve after medication please follow-up with PCP.

## 2024-05-03 ENCOUNTER — Telehealth: Payer: Self-pay

## 2024-05-03 DIAGNOSIS — N898 Other specified noninflammatory disorders of vagina: Secondary | ICD-10-CM

## 2024-05-03 MED ORDER — FLUCONAZOLE 150 MG PO TABS: 150.0000 mg | ORAL_TABLET | Freq: Once | ORAL | 0 refills | Status: AC

## 2024-05-03 NOTE — Telephone Encounter (Signed)
 Received call from patient who reports vaginal itching.  She has had yeast infections before and this feels the same to her.  She is requesting Diflucan .  Per our protocol, as she is up to date on her annual and we have prescribed this for her in the past, Diflucan  rx sent to patient pharmacy.  If no improvement in symptoms, she will make an appointment to be swabbed.

## 2024-05-08 ENCOUNTER — Other Ambulatory Visit: Payer: Self-pay

## 2024-05-08 MED ORDER — FLUCONAZOLE 150 MG PO TABS: 150.0000 mg | ORAL_TABLET | Freq: Once | ORAL | 0 refills | Status: AC

## 2024-05-10 NOTE — Progress Notes (Signed)
    NURSE VISIT NOTE  Subjective:    Patient ID: PORSCHIA WILLBANKS, female    DOB: 06-11-1991, 33 y.o.   MRN: [id number]  HPI  Patient is a 33 y.o. G58P1011 female who presents for {pe vag discharge desc:315065} vaginal discharge for *** {gen duration:315003}. Denies abnormal vaginal bleeding or significant pelvic pain or fever. {Actions; denies/reports/admits to:19208} {UTI Symptoms:210800002}. Patient {has/denies:33800} history of known exposure to STD.   Objective:    There were no vitals taken for this visit.   No results found for any visits on 05/11/24.  Assessment:   No diagnosis found.  {vaginitis type:315262}  Plan:   GC and chlamydia DNA  probe sent to lab. Treatment: {vaginitis tx:315263} ROV prn if symptoms persist or worsen.   Mathis LITTIE Getting, CMA

## 2024-05-11 ENCOUNTER — Other Ambulatory Visit (HOSPITAL_COMMUNITY)
Admission: RE | Admit: 2024-05-11 | Discharge: 2024-05-11 | Disposition: A | Discharge status: Home / Self Care | Source: Ambulatory Visit | Attending: Registered Nurse | Admitting: Registered Nurse

## 2024-05-11 ENCOUNTER — Ambulatory Visit

## 2024-05-11 VITALS — Ht 63.0 in | Wt 168.9 lb

## 2024-05-11 DIAGNOSIS — N898 Other specified noninflammatory disorders of vagina: Secondary | ICD-10-CM | POA: Diagnosis not present

## 2024-05-15 LAB — CERVICOVAGINAL ANCILLARY ONLY
Bacterial Vaginitis (gardnerella): NEGATIVE
Candida Glabrata: NEGATIVE
Candida Vaginitis: NEGATIVE
Chlamydia: NEGATIVE
Comment: NEGATIVE
Comment: NEGATIVE
Comment: NEGATIVE
Comment: NEGATIVE
Comment: NEGATIVE
Comment: NORMAL
Neisseria Gonorrhea: NEGATIVE
Trichomonas: NEGATIVE

## 2024-05-16 ENCOUNTER — Ambulatory Visit: Payer: Self-pay | Admitting: Registered Nurse

## 2024-05-17 NOTE — Telephone Encounter (Signed)
 Hi Lydia, patient called triage line with complaints of external vaginal burning sensation and she is asking for further advise or an appointment with a provider. No appointments today or tomorrow. Is there anything she can try OTC or be prescribed for that burning sensation? Denies vaginal discharge, odor, itching and/or UTI sx.

## 2024-05-21 ENCOUNTER — Encounter: Payer: Self-pay | Admitting: Obstetrics

## 2024-05-21 ENCOUNTER — Ambulatory Visit: Admitting: Obstetrics

## 2024-05-21 VITALS — BP 121/84 | HR 80 | Ht 63.0 in | Wt 169.2 lb

## 2024-05-21 DIAGNOSIS — R300 Dysuria: Secondary | ICD-10-CM

## 2024-05-21 DIAGNOSIS — N898 Other specified noninflammatory disorders of vagina: Secondary | ICD-10-CM

## 2024-05-21 LAB — POCT URINALYSIS DIPSTICK
Appearance: NEGATIVE
Bilirubin, UA: NEGATIVE
Blood, UA: NEGATIVE
Glucose, UA: NEGATIVE
Ketones, UA: NEGATIVE
Leukocytes, UA: NEGATIVE
Nitrite, UA: NEGATIVE
Protein, UA: POSITIVE — AB
Spec Grav, UA: 1.01 (ref 1.010–1.025)
Urobilinogen, UA: 0.2 U/dL
pH, UA: 7 (ref 5.0–8.0)

## 2024-05-21 NOTE — Progress Notes (Signed)
 GYN ENCOUNTER NOTE  Subjective:       Emily Dean is a 33 y.o. G36P1011 female is here for gynecologic evaluation of the following issues:  1. Vaginal Irritation for 2-3 weeks. Patient states that she was seen in office a week ago and vaginal swab was negative for yeast and BV. Patient reports that she is having burning when wiping and after urination. The burning lasts about 10 minutes after wiping. It does not hurt at other times. Patient denies vaginal discharge, odor, lesions, or vaginal itching. The pain is mostly localized to her urethra, although she reports her labia are a little irritated. She has not been sexually active since she started having symptoms. She has not changed detergents or hygiene products. She denies any trauma. She has been using coconut oil and has seen some improvement.   Gynecologic History No LMP recorded (lmp unknown). Contraception: none Last Pap: 12/12/2023. Results were: abnormal   Obstetric History OB History  Gravida Para Term Preterm AB Living  2 1 1  1 1   SAB IAB Ectopic Multiple Live Births   1  0 1    # Outcome Date GA Lbr Len/2nd Weight Sex Type Anes PTL Lv  2 Term 10/28/16 [redacted]w[redacted]d / 00:23 5 lb 12.1 oz (2.61 kg) M Vag-Spont None  LIV  1 IAB 2008            Past Medical History:  Diagnosis Date   Migraine     Past Surgical History:  Procedure Laterality Date   NO PAST SURGERIES      Current Outpatient Medications on File Prior to Visit  Medication Sig Dispense Refill   albuterol  (VENTOLIN  HFA) 108 (90 Base) MCG/ACT inhaler Inhale 1-2 puffs into the lungs every 6 (six) hours as needed for wheezing or shortness of breath. 1 each 0   ALPRAZolam  (XANAX ) 1 MG tablet Take upon arrival to the office after you have signed your consent form. You cannot drive home so have someone available to drive you home. (Patient not taking: Reported on 05/21/2024) 1 tablet 0   promethazine -dextromethorphan (PROMETHAZINE -DM) 6.25-15 MG/5ML syrup Take 5  mLs by mouth 4 (four) times daily as needed. (Patient not taking: Reported on 05/21/2024) 118 mL 0   No current facility-administered medications on file prior to visit.    No Known Allergies  Social History   Socioeconomic History   Marital status: Married    Spouse name: Not on file   Number of children: Not on file   Years of education: Not on file   Highest education level: Not on file  Occupational History   Not on file  Tobacco Use   Smoking status: Never   Smokeless tobacco: Never  Vaping Use   Vaping status: Never Used  Substance and Sexual Activity   Alcohol use: No   Drug use: No   Sexual activity: Yes    Birth control/protection: Condom, Pill  Other Topics Concern   Not on file  Social History Narrative   Not on file   Social Drivers of Health   Financial Resource Strain: Medium Risk (09/12/2023)   Received [facility]   Overall Financial Resource Strain (CARDIA)    Difficulty of Paying Living Expenses: Somewhat hard  Food Insecurity: No Food Insecurity (09/12/2023)   Received from [facility]   Hunger Vital Sign    Within the past 12 months, you worried that your food would run out before you got the  money to buy more.: Never true    Within the past 12 months, the food you bought just didn't last and you didn't have money to get more.: Never true  Transportation Needs: No Transportation Needs (09/12/2023)   Received from [facility] - Transportation    In the past 12 months, has lack of transportation kept you from medical appointments or from getting medications?: No    Lack of Transportation (Non-Medical): No  Physical Activity: Not on file  Stress: Not on file  Social Connections: Not on file  Intimate Partner Violence: Not on file    Family History  Problem Relation Age of Onset   Rheum arthritis Mother    Diabetes Father    Thyroid  disease Father    Breast cancer Neg Hx     Ovarian cancer Neg Hx    Colon cancer Neg Hx     The following portions of the patient's history were reviewed and updated as appropriate: allergies, current medications, past family history, past medical history, past social history, past surgical history and problem list.  Review of Systems See HPI  Objective:   BP 121/84   Pulse 80   Ht 5' 3 (1.6 m)   Wt 169 lb 3.2 oz (76.7 kg)   LMP  (LMP Unknown)   BMI 29.97 kg/m  CONSTITUTIONAL: Well-developed, well-nourished female in no acute distress.  HENT:  Normocephalic, atraumatic.  Pelvic exam: VULVA: normal appearing vulva with no masses, tenderness or lesions, mild erythema on inner labia. Clitoris, clitoral hood, and urethra without erythema, lesions, or discharge, VAGINA: normal appearing vaginal vestibule  with normal color and discharge, no lesions.  UA not indicative of infection   Assessment:   Vulvar burning   Plan:   -Continue coconut oil. Use peribottle with urination. Avoid irritating soaps, detergent, and clothing. Return if symptoms worsen or do not improve over a few weeks.  Kelcey Wickstrom M Jameshia Hayashida, CNM
